# Patient Record
Sex: Male | Born: 1960
Health system: Southern US, Community
[De-identification: ages and names within clinical notes are randomized; demographics above are authoritative.]

## PROBLEM LIST (undated history)

## (undated) DIAGNOSIS — F40298 Other specified phobia: Secondary | ICD-10-CM

## (undated) DIAGNOSIS — I714 Abdominal aortic aneurysm, without rupture, unspecified: Secondary | ICD-10-CM

## (undated) DIAGNOSIS — K219 Gastro-esophageal reflux disease without esophagitis: Secondary | ICD-10-CM

## (undated) DIAGNOSIS — C6292 Malignant neoplasm of left testis, unspecified whether descended or undescended: Secondary | ICD-10-CM

## (undated) DIAGNOSIS — E785 Hyperlipidemia, unspecified: Secondary | ICD-10-CM

## (undated) DIAGNOSIS — I10 Essential (primary) hypertension: Secondary | ICD-10-CM

## (undated) DIAGNOSIS — R51 Headache: Secondary | ICD-10-CM

## (undated) DIAGNOSIS — F419 Anxiety disorder, unspecified: Secondary | ICD-10-CM

## (undated) HISTORY — DX: Essential (primary) hypertension: I10

## (undated) HISTORY — DX: Abdominal aortic aneurysm, without rupture, unspecified: I71.40

## (undated) HISTORY — DX: Malignant neoplasm of left testis, unspecified whether descended or undescended: C62.92

## (undated) HISTORY — DX: Abdominal aortic aneurysm, without rupture: I71.4

---
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
## 2001-05-09 ENCOUNTER — Ambulatory Visit (HOSPITAL_COMMUNITY): Admission: RE | Admit: 2001-05-09 | Discharge: 2001-05-09 | Payer: Self-pay | Admitting: Family Medicine

## 2001-05-09 ENCOUNTER — Encounter: Payer: Self-pay | Admitting: Family Medicine

## 2004-08-18 ENCOUNTER — Encounter: Admission: RE | Admit: 2004-08-18 | Discharge: 2004-08-18 | Payer: Self-pay | Admitting: Occupational Medicine

## 2006-05-24 ENCOUNTER — Ambulatory Visit (HOSPITAL_COMMUNITY): Admission: RE | Admit: 2006-05-24 | Discharge: 2006-05-24 | Payer: Self-pay | Admitting: Family Medicine

## 2010-09-14 ENCOUNTER — Other Ambulatory Visit (HOSPITAL_COMMUNITY): Payer: Self-pay | Admitting: Family Medicine

## 2010-09-14 ENCOUNTER — Ambulatory Visit (HOSPITAL_COMMUNITY)
Admission: RE | Admit: 2010-09-14 | Discharge: 2010-09-14 | Disposition: A | Discharge status: Home / Self Care | Payer: BC Managed Care – PPO | Source: Ambulatory Visit | Attending: Family Medicine | Admitting: Family Medicine

## 2010-09-14 ENCOUNTER — Encounter (HOSPITAL_COMMUNITY): Payer: Self-pay

## 2010-09-14 DIAGNOSIS — I10 Essential (primary) hypertension: Secondary | ICD-10-CM | POA: Insufficient documentation

## 2010-09-14 DIAGNOSIS — R918 Other nonspecific abnormal finding of lung field: Secondary | ICD-10-CM | POA: Insufficient documentation

## 2010-09-14 DIAGNOSIS — Z0000 Encounter for general adult medical examination without abnormal findings: Secondary | ICD-10-CM | POA: Insufficient documentation

## 2010-09-15 ENCOUNTER — Ambulatory Visit (HOSPITAL_COMMUNITY)
Admission: RE | Admit: 2010-09-15 | Discharge: 2010-09-15 | Disposition: A | Discharge status: Home / Self Care | Payer: BC Managed Care – PPO | Source: Ambulatory Visit | Attending: Family Medicine | Admitting: Family Medicine

## 2010-09-15 ENCOUNTER — Other Ambulatory Visit (HOSPITAL_COMMUNITY): Payer: Self-pay | Admitting: Family Medicine

## 2010-09-15 DIAGNOSIS — R918 Other nonspecific abnormal finding of lung field: Secondary | ICD-10-CM | POA: Insufficient documentation

## 2010-09-15 DIAGNOSIS — I10 Essential (primary) hypertension: Secondary | ICD-10-CM

## 2011-10-20 ENCOUNTER — Encounter (INDEPENDENT_AMBULATORY_CARE_PROVIDER_SITE_OTHER): Payer: Self-pay | Admitting: *Deleted

## 2011-11-01 ENCOUNTER — Encounter (INDEPENDENT_AMBULATORY_CARE_PROVIDER_SITE_OTHER): Payer: Self-pay | Admitting: Internal Medicine

## 2011-11-01 ENCOUNTER — Ambulatory Visit (INDEPENDENT_AMBULATORY_CARE_PROVIDER_SITE_OTHER): Payer: BC Managed Care – PPO | Admitting: Internal Medicine

## 2011-11-01 VITALS — BP 110/80 | HR 72 | Temp 98.1°F | Ht 73.0 in | Wt 197.2 lb

## 2011-11-01 DIAGNOSIS — K649 Unspecified hemorrhoids: Secondary | ICD-10-CM | POA: Insufficient documentation

## 2011-11-01 DIAGNOSIS — K625 Hemorrhage of anus and rectum: Secondary | ICD-10-CM

## 2011-11-01 NOTE — Patient Instructions (Addendum)
Screening colonoscopy 

## 2011-11-01 NOTE — Progress Notes (Signed)
Subjective:     Patient ID: Shane Faulkner, male   DOB: [birth year], 51 y.o.   MRN: [id number]  HPI [place] is a 51 yr old male referred to our office for rectal bleeding. He says he has had rectal bleeding which he says was hemorrhoidal. He says his hemorrhoids are external. He was given an Rx for Protofoam.  Appetite is good.  No weight loss. He has a BM about 1-2 a day.  No abdominal pain. Patient is due for a screening colonoscopy.   Review of Systems see hpi Current Outpatient Prescriptions  Medication Sig Dispense Refill  . amLODipine (NORVASC) 5 MG tablet Take 5 mg by mouth daily.      Marland Kitchen lisinopril-hydrochlorothiazide (PRINZIDE,ZESTORETIC) 20-25 MG per tablet Take 1 tablet by mouth daily.      . naproxen (NAPROSYN) 500 MG tablet Take 500 mg by mouth 2 (two) times daily with a meal.       Past Medical History  Diagnosis Date  . Hypertension   . Hypertension    Past Surgical History  Procedure Date  . Tonsillectomy    Family Status  Relation Status Death Age  . Mother Alive     good health  . Father Alive     Crohn's disease   History   Social History  . Marital Status: Divorced    Spouse Name: N/A    Number of Children: N/A  . Years of Education: N/A   Occupational History  . Not on file.   Social History Main Topics  . Smoking status: Current Everyday Smoker  . Smokeless tobacco: Not on file   Comment: 1 1/2 PACK A DAY.  Marland Kitchen Alcohol Use: Yes     Drinks a case a week  . Drug Use: Not on file  . Sexually Active: Not on file   Other Topics Concern  . Not on file   Social History Narrative  . No narrative on file   Allergies  Allergen Reactions  . Penicillins    Past Medical History  Diagnosis Date  . Hypertension   . Hypertension         Objective:   Physical Exam Filed Vitals:   11/01/11 1540  Height: 6\' 1"  (1.854 m)  Weight: 197 lb 3.2 oz (89.449 kg)   Alert and oriented. Skin warm and dry. Oral mucosa is moist.   . Sclera anicteric,  conjunctivae is pink. Thyroid not enlarged. No cervical lymphadenopathy. Lungs clear. Heart regular rate and rhythm.  Abdomen is soft. Bowel sounds are positive. No hepatomegaly. No abdominal masses felt. No tenderness. External hemorrhoids noted.  No edema to lower extremities.       Assessment:    In need of screening colonoscopy. Hx of rectal bleeding. Colonic neoplasm needs to be ruled out as well as internal hemorrhoids.    Plan:    Colonoscopy,The risks and benefits such as perforation, bleeding, and infection were reviewed with the patient and is agreeable.

## 2011-11-11 ENCOUNTER — Telehealth (INDEPENDENT_AMBULATORY_CARE_PROVIDER_SITE_OTHER): Payer: Self-pay | Admitting: *Deleted

## 2011-11-11 ENCOUNTER — Encounter (INDEPENDENT_AMBULATORY_CARE_PROVIDER_SITE_OTHER): Payer: Self-pay | Admitting: *Deleted

## 2011-11-11 NOTE — Telephone Encounter (Signed)
Patient was seen 7/2 bu Terri and needs to be sch'd for TCS, I left several messages for patient to call me but no response. I have mailed him a letter asking him to me to sch TCS

## 2012-05-17 ENCOUNTER — Other Ambulatory Visit (HOSPITAL_COMMUNITY): Payer: Self-pay | Admitting: Internal Medicine

## 2012-05-17 DIAGNOSIS — D2920 Benign neoplasm of unspecified testis: Secondary | ICD-10-CM

## 2012-05-18 ENCOUNTER — Ambulatory Visit (HOSPITAL_COMMUNITY)
Admission: RE | Admit: 2012-05-18 | Discharge: 2012-05-18 | Disposition: A | Discharge status: Home / Self Care | Payer: BC Managed Care – PPO | Source: Ambulatory Visit | Attending: Internal Medicine | Admitting: Internal Medicine

## 2012-05-18 ENCOUNTER — Other Ambulatory Visit (HOSPITAL_COMMUNITY): Payer: Self-pay | Admitting: Internal Medicine

## 2012-05-18 DIAGNOSIS — R9389 Abnormal findings on diagnostic imaging of other specified body structures: Secondary | ICD-10-CM | POA: Insufficient documentation

## 2012-05-18 DIAGNOSIS — D2920 Benign neoplasm of unspecified testis: Secondary | ICD-10-CM

## 2012-05-18 DIAGNOSIS — N5089 Other specified disorders of the male genital organs: Secondary | ICD-10-CM | POA: Insufficient documentation

## 2012-05-29 ENCOUNTER — Ambulatory Visit (INDEPENDENT_AMBULATORY_CARE_PROVIDER_SITE_OTHER): Payer: BC Managed Care – PPO | Admitting: Urology

## 2012-05-29 ENCOUNTER — Other Ambulatory Visit: Payer: Self-pay | Admitting: Urology

## 2012-05-29 DIAGNOSIS — E299 Testicular dysfunction, unspecified: Secondary | ICD-10-CM

## 2012-05-30 NOTE — Patient Instructions (Signed)
Your procedure is scheduled on:06/05/2012   Report to [facility] at 6:15    AM.  Call this number if you have problems the morning of surgery: [phone]   Remember:   Do not drink or eat food:After Midnight.  :  Take these medicines the morning of surgery with A SIP OF WATER: Amlodipine, lisinopril   Do not wear jewelry, make-up or nail polish.  Do not wear lotions, powders, or perfumes. You may wear deodorant.  Do not shave 48 hours prior to surgery. Men may shave face and neck.  Do not bring valuables to the hospital.  Contacts, dentures or bridgework may not be worn into surgery.  Leave suitcase in the car. After surgery it may be brought to your room.  For patients admitted to the hospital, checkout time is 11:00 AM the day of discharge.   Patients discharged the day of surgery will not be allowed to drive home.    Special Instructions: Shower using CHG 2 nights before surgery and the night before surgery.  If you shower the day of surgery use CHG.  Use special wash - you have one bottle of CHG for all showers.  You should use approximately 1/3 of the bottle for each shower.   Please read over the following fact sheets that you were given: Pain Booklet, MRSA Information, Surgical Site Infection Prevention and Care and Recovery After Surgery   Orchiectomy An orchiectomy is the removal of the testicles. This is something you should have discussed at length with your surgeon or caregiver prior to having this done. This is a man's main source of the male sex hormone testosterone. An orchiectomy is most often done to treat cancer of the prostate. Prostatic cancer usually needs testosterone to grow.  The main advantages of this procedure are that it is safe, effective and simple with a low risk of problems or complications. Surgery is also less expensive than monthly injections over the long run. The surgery has the same effects as hormone treatment. It eliminates the need for daily pills or  monthly shots. Drawbacks to surgery are it is permanent. This procedure can be done in ways that make you appear to be anatomically intact following the surgery. The testicles can be replaced with artificial testicles.  The major disadvantage seems to be psychological. Some men consider this procedure a loss of being a male. However, the psychological impact should be weighed against the therapeutic benefits of the procedure in its treatment for prostate cancer  PROCEDURE  This procedure may be done as an outpatient procedure or sometimes as an inpatient with a short hospital stay. Regular activities are usually resumed within 1 to 2 weeks. Full recovery can take up to a month. This is a surgery which can be done under local anesthesia. This means the area being worked on will be made numb with a medication similar to Novocaine. Sometimes a general anesthetic or light sedation may be used and you may be sleeping during the procedure. A spinal anesthetic can also be used in which you are numb from the waist down.  After surgery, you will be taken to the recovery area where a nurse will watch you and check your progress. Once awake, stable, and taking fluids well, without other problems you will be allowed to go home. HOME CARE INSTRUCTIONS  Once home, an ice pack applied to the operative site for 15 to 20 minutes, 3 to 4 times per day may help with discomfort and  keep swelling down. Place a towel between your skin and the ice pack.  Only take over-the-counter or prescription medicines for pain, discomfort, or fever as directed by your caregiver.  You may continue a normal diet and activities as directed.  There should be no heavy lifting (more than 10 pounds), strenuous activities or contact sports for four weeks, or as directed.  Change dressings as directed. Keep the wound dry and clean. The wound may be washed gently with soap and water. Gently blot or dab dry without rubbing. Do not take baths,  use swimming pools or hot tubs for ten days, or as instructed by your caregivers. SEEK MEDICAL CARE IF:   There is redness, swelling, or increasing pain in the wound area.  Pus is coming from the wound.  An unexplained oral temperature above 102 F (38.9 C) develops.  You notice a foul smell coming from the wound or dressing.  A breaking open of the wound (edges not staying together) after sutures have been removed.  There is increasing abdominal pain. SEEK IMMEDIATE MEDICAL CARE IF:   A rash develops.  You have difficulty breathing, or you develop a reaction or side effects to medications given. Document Released: 03/18/2005 Document Revised: 07/11/2011 Document Reviewed: 05/21/2008 [facility] Patient Information 2013 [place], [place]. PATIENT INSTRUCTIONS POST-ANESTHESIA  IMMEDIATELY FOLLOWING SURGERY:  Do not drive or operate machinery for the first twenty four hours after surgery.  Do not make any important decisions for twenty four hours after surgery or while taking narcotic pain medications or sedatives.  If you develop intractable nausea and vomiting or a severe headache please notify your doctor immediately.  FOLLOW-UP:  Please make an appointment with your surgeon as instructed. You do not need to follow up with anesthesia unless specifically instructed to do so.  WOUND CARE INSTRUCTIONS (if applicable):  Keep a dry clean dressing on the anesthesia/puncture wound site if there is drainage.  Once the wound has quit draining you may leave it open to air.  Generally you should leave the bandage intact for twenty four hours unless there is drainage.  If the epidural site drains for more than 36-48 hours please call the anesthesia department.  QUESTIONS?:  Please feel free to call your physician or the hospital operator if you have any questions, and they will be happy to assist you.

## 2012-05-31 ENCOUNTER — Encounter (HOSPITAL_COMMUNITY): Admission: RE | Admit: 2012-05-31 | Discharge: 2012-05-31 | Payer: BC Managed Care – PPO | Source: Ambulatory Visit

## 2012-05-31 ENCOUNTER — Encounter (HOSPITAL_COMMUNITY): Payer: Self-pay | Admitting: Pharmacy Technician

## 2012-06-01 NOTE — Patient Instructions (Signed)
Shane Faulkner  06/01/2012   Your procedure is scheduled on:  06/05/12  Report to [facility] at [place] AM.  Call this number if you have problems the morning of surgery: [phone]   Remember:   Do not eat food or drink liquids after midnight.   Take these medicines the morning of surgery with A SIP OF WATER: norvasc, zestoretic   Do not wear jewelry, make-up or nail polish.  Do not wear lotions, powders, or perfumes. You may wear deodorant.  Do not shave 48 hours prior to surgery. Men may shave face and neck.  Do not bring valuables to the hospital.  Contacts, dentures or bridgework may not be worn into surgery.  Leave suitcase in the car. After surgery it may be brought to your room.  For patients admitted to the hospital, checkout time is 11:00 AM the day of  discharge.   Patients discharged the day of surgery will not be allowed to drive  home.  Name and phone number of your driver: family  Special Instructions: Shower using CHG 2 nights before surgery and the night before surgery.  If you shower the day of surgery use CHG.  Use special wash - you have one bottle of CHG for all showers.  You should use approximately 1/3 of the bottle for each shower.   Please read over the following fact sheets that you were given: Pain Booklet, MRSA Information, Surgical Site Infection Prevention, Anesthesia Post-op Instructions and Care and Recovery After Surgery   PATIENT INSTRUCTIONS POST-ANESTHESIA  IMMEDIATELY FOLLOWING SURGERY:  Do not drive or operate machinery for the first twenty four hours after surgery.  Do not make any important decisions for twenty four hours after surgery or while taking narcotic pain medications or sedatives.  If you develop intractable nausea and vomiting or a severe headache please notify your doctor immediately.  FOLLOW-UP:  Please make an appointment with your surgeon as instructed. You do not need to follow up with anesthesia unless specifically instructed to do  so.  WOUND CARE INSTRUCTIONS (if applicable):  Keep a dry clean dressing on the anesthesia/puncture wound site if there is drainage.  Once the wound has quit draining you may leave it open to air.  Generally you should leave the bandage intact for twenty four hours unless there is drainage.  If the epidural site drains for more than 36-48 hours please call the anesthesia department.  QUESTIONS?:  Please feel free to call your physician or the hospital operator if you have any questions, and they will be happy to assist you.      Orchiectomy An orchiectomy is the removal of the testicles. This is something you should have discussed at length with your surgeon or caregiver prior to having this done. This is a man's main source of the male sex hormone testosterone. An orchiectomy is most often done to treat cancer of the prostate. Prostatic cancer usually needs testosterone to grow.  The main advantages of this procedure are that it is safe, effective and simple with a low risk of problems or complications. Surgery is also less expensive than monthly injections over the long run. The surgery has the same effects as hormone treatment. It eliminates the need for daily pills or monthly shots. Drawbacks to surgery are it is permanent. This procedure can be done in ways that make you appear to be anatomically intact following the surgery. The testicles can be replaced with artificial testicles.  The major disadvantage seems to  be psychological. Some men consider this procedure a loss of being a male. However, the psychological impact should be weighed against the therapeutic benefits of the procedure in its treatment for prostate cancer  PROCEDURE  This procedure may be done as an outpatient procedure or sometimes as an inpatient with a short hospital stay. Regular activities are usually resumed within 1 to 2 weeks. Full recovery can take up to a month. This is a surgery which can be done under local  anesthesia. This means the area being worked on will be made numb with a medication similar to Novocaine. Sometimes a general anesthetic or light sedation may be used and you may be sleeping during the procedure. A spinal anesthetic can also be used in which you are numb from the waist down.  After surgery, you will be taken to the recovery area where a nurse will watch you and check your progress. Once awake, stable, and taking fluids well, without other problems you will be allowed to go home. HOME CARE INSTRUCTIONS  Once home, an ice pack applied to the operative site for 15 to 20 minutes, 3 to 4 times per day may help with discomfort and keep swelling down. Place a towel between your skin and the ice pack.  Only take over-the-counter or prescription medicines for pain, discomfort, or fever as directed by your caregiver.  You may continue a normal diet and activities as directed.  There should be no heavy lifting (more than 10 pounds), strenuous activities or contact sports for four weeks, or as directed.  Change dressings as directed. Keep the wound dry and clean. The wound may be washed gently with soap and water. Gently blot or dab dry without rubbing. Do not take baths, use swimming pools or hot tubs for ten days, or as instructed by your caregivers. SEEK MEDICAL CARE IF:   There is redness, swelling, or increasing pain in the wound area.  Pus is coming from the wound.  An unexplained oral temperature above 102 F (38.9 C) develops.  You notice a foul smell coming from the wound or dressing.  A breaking open of the wound (edges not staying together) after sutures have been removed.  There is increasing abdominal pain. SEEK IMMEDIATE MEDICAL CARE IF:   A rash develops.  You have difficulty breathing, or you develop a reaction or side effects to medications given. Document Released: 03/18/2005 Document Revised: 07/11/2011 Document Reviewed: 05/21/2008 [facility] Patient  Information 2013 [place], [place].

## 2012-06-02 DIAGNOSIS — C6292 Malignant neoplasm of left testis, unspecified whether descended or undescended: Secondary | ICD-10-CM

## 2012-06-02 HISTORY — DX: Malignant neoplasm of left testis, unspecified whether descended or undescended: C62.92

## 2012-06-04 ENCOUNTER — Encounter (HOSPITAL_COMMUNITY)
Admission: RE | Admit: 2012-06-04 | Discharge: 2012-06-04 | Disposition: A | Discharge status: Home / Self Care | Payer: BC Managed Care – PPO | Source: Ambulatory Visit | Attending: Urology | Admitting: Urology

## 2012-06-04 ENCOUNTER — Encounter (HOSPITAL_COMMUNITY): Payer: Self-pay

## 2012-06-04 ENCOUNTER — Other Ambulatory Visit: Payer: Self-pay

## 2012-06-04 HISTORY — DX: Gastro-esophageal reflux disease without esophagitis: K21.9

## 2012-06-04 HISTORY — DX: Hyperlipidemia, unspecified: E78.5

## 2012-06-04 HISTORY — DX: Headache: R51

## 2012-06-04 LAB — BASIC METABOLIC PANEL
Calcium: 10.2 mg/dL (ref 8.4–10.5)
Creatinine, Ser: 1.28 mg/dL (ref 0.50–1.35)
GFR calc Af Amer: 73 mL/min — ABNORMAL LOW (ref >90)
GFR calc non Af Amer: 63 mL/min — ABNORMAL LOW (ref >90)

## 2012-06-04 LAB — HEMOGLOBIN AND HEMATOCRIT, BLOOD: Hemoglobin: 17.7 g/dL — ABNORMAL HIGH (ref 13.0–17.0)

## 2012-06-04 LAB — SURGICAL PCR SCREEN
MRSA, PCR: NEGATIVE
Staphylococcus aureus: NEGATIVE

## 2012-06-04 NOTE — H&P (Signed)
Urology History and Physical Exam  CC: Testicular mass  HPI: 52 year old male presents for left inguinal orchiectomy for a probable left testicular carcinoma. He was seen a week ago for a left testicular mass. His AFP and LDH levels were normal, his B HCG was elevated. His initial note is below:   This 52 year old male is referred by Dr. Assunta Found for evaluation and management of a left testicular mass. The patient states that he first noted this about a month ago. It has grown in size. He has no pain or tenderness associated with this. He had a recent scrotal ultrasound which revealed a heterogeneous mass of the left testicle, with a normal appearing right testicle. It was recommended that he see a urologist. He denies any infections or trauma to his testicles in the past.  He is a smoker. He works for a [employer]. He denies any cardiac or pulmonary issues. He does have erectile dysfunction, and has used Cialis for this in the past successfully.    PMH: Past Medical History  Diagnosis Date  . Hypertension   . Hypertension     PSH: Past Surgical History  Procedure Date  . Tonsillectomy     Allergies: Allergies  Allergen Reactions  . Penicillins     Medications: No prescriptions prior to admission     Social History: History   Social History  . Marital Status: Divorced    Spouse Name: N/A    Number of Children: N/A  . Years of Education: N/A   Occupational History  . Not on file.   Social History Main Topics  . Smoking status: Current Every Day Smoker  . Smokeless tobacco: Not on file     Comment: 1 1/2 PACK A DAY.  Marland Kitchen Alcohol Use: Yes     Comment: Drinks a case a week  . Drug Use: Not on file  . Sexually Active: Not on file   Other Topics Concern  . Not on file   Social History Narrative  . No narrative on file    Family History: No family history on file.  Review of Systems: Genitourinary, constitutional, skin, eye, otolaryngeal,  hematologic/lymphatic, cardiovascular, pulmonary, endocrine, musculoskeletal, gastrointestinal, neurological and psychiatric system(s) were reviewed and pertinent findings if present are noted.  Genitourinary: nocturia and erectile dysfunction.    Constitutional: Well nourished and well developed . No acute distress.  ENT:. The ears and nose are normal in appearance.  Neck: The appearance of the neck is normal and no neck mass is present.  Pulmonary: No respiratory distress and normal respiratory rhythm and effort.  Cardiovascular: Heart rate and rhythm are normal . No peripheral edema.  Abdomen: The abdomen is rounded. The abdomen is soft and nontender. No masses are palpated. No CVA tenderness. No hernias are palpable. No hepatosplenomegaly noted.  Rectal: Rectal exam demonstrates normal sphincter tone, the anus is normal on inspection., no tenderness and no masses. Estimated prostate size is 2+. Normal rectal tone, no rectal masses, prostate is smooth, symmetric and non-tender. The prostate has no nodularity and is not tender. The left seminal vesicle is nonpalpable. The right seminal vesicle is nonpalpable. The perineum is normal on inspection.  Genitourinary: The penis is circumcised. The scrotum is normal in appearance. The right vas deferens is is palpably normal. The left vas deferens is palpably normal. The right testis is palpably normal. The left testis is found to have a 3.0 cm mass.  Skin: Normal skin turgor, no visible rash and  no visible skin lesions.  Neuro/Psych:. Mood and affect are appropriate.                      Studies:  No results found for this basename: HGB:2,WBC:2,PLT:2 in the last 72 hours  No results found for this basename: NA:2,K:2,CL:2,CO2:2,BUN:2,CREATININE:2,CALCIUM:2,MAGNESIUM:2,GFRNONAA:2,GFRAA:2 in the last 72 hours   No results found for this basename: PT:2,INR:2,APTT:2 in the last 72 hours   No components found with this basename:  ABG:2    Assessment:   1. 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  2. Erectile dysfunction, organic. He uses Cialis for this.   Plan: Left inguinal orchiectomy

## 2012-06-04 NOTE — Progress Notes (Signed)
06/04/12 1411  OBSTRUCTIVE SLEEP APNEA  Have you ever been diagnosed with sleep apnea through a sleep study? No  Do you snore loudly (loud enough to be heard through closed doors)?  1  Do you often feel tired, fatigued, or sleepy during the daytime? 1  Has anyone observed you stop breathing during your sleep? 1  Do you have, or are you being treated for high blood pressure? 1  BMI more than 35 kg/m2? 0  Age over 52 years old? 1  Neck circumference greater than 40 cm/18 inches? 0  Gender: 1  Obstructive Sleep Apnea Score 6   Score 4 or greater  Results sent to PCP

## 2012-06-05 ENCOUNTER — Encounter (HOSPITAL_COMMUNITY): Payer: Self-pay | Admitting: *Deleted

## 2012-06-05 ENCOUNTER — Ambulatory Visit (HOSPITAL_COMMUNITY): Payer: BC Managed Care – PPO | Admitting: Anesthesiology

## 2012-06-05 ENCOUNTER — Ambulatory Visit (HOSPITAL_COMMUNITY)
Admission: RE | Admit: 2012-06-05 | Discharge: 2012-06-05 | Disposition: A | Discharge status: Home / Self Care | Payer: BC Managed Care – PPO | Source: Ambulatory Visit | Attending: Urology | Admitting: Urology

## 2012-06-05 ENCOUNTER — Encounter (HOSPITAL_COMMUNITY): Payer: Self-pay | Admitting: Anesthesiology

## 2012-06-05 ENCOUNTER — Encounter (HOSPITAL_COMMUNITY)
Admission: RE | Disposition: A | Discharge status: Home / Self Care | Payer: Self-pay | Source: Ambulatory Visit | Attending: Urology

## 2012-06-05 DIAGNOSIS — Z01810 Encounter for preprocedural cardiovascular examination: Secondary | ICD-10-CM | POA: Insufficient documentation

## 2012-06-05 DIAGNOSIS — C6290 Malignant neoplasm of unspecified testis, unspecified whether descended or undescended: Secondary | ICD-10-CM | POA: Insufficient documentation

## 2012-06-05 DIAGNOSIS — Z01812 Encounter for preprocedural laboratory examination: Secondary | ICD-10-CM | POA: Insufficient documentation

## 2012-06-05 DIAGNOSIS — I10 Essential (primary) hypertension: Secondary | ICD-10-CM | POA: Insufficient documentation

## 2012-06-05 HISTORY — PX: ORCHIECTOMY: SHX2116

## 2012-06-05 SURGERY — ORCHIECTOMY
Anesthesia: General | Laterality: Left | Wound class: Clean

## 2012-06-05 MED ORDER — ONDANSETRON HCL 4 MG/2ML IJ SOLN: 4.0000 mg | Freq: Four times a day (QID) | INTRAMUSCULAR | Status: DC | PRN

## 2012-06-05 MED ORDER — NEOSTIGMINE METHYLSULFATE 1 MG/ML IJ SOLN
INTRAMUSCULAR | Status: DC | PRN
  Administered 2012-06-05: 1 mg via INTRAVENOUS
  Administered 2012-06-05: 4 mg via INTRAVENOUS

## 2012-06-05 MED ORDER — SODIUM CHLORIDE 0.9 % IJ SOLN: 3.0000 mL | INTRAMUSCULAR | Status: DC | PRN

## 2012-06-05 MED ORDER — SODIUM CHLORIDE 0.9 % IJ SOLN: 3.0000 mL | Freq: Two times a day (BID) | INTRAMUSCULAR | Status: DC

## 2012-06-05 MED ORDER — KETOROLAC TROMETHAMINE 30 MG/ML IJ SOLN
INTRAMUSCULAR | Status: AC
  Filled 2012-06-05: qty 1

## 2012-06-05 MED ORDER — MIDAZOLAM HCL 2 MG/2ML IJ SOLN
INTRAMUSCULAR | Status: AC
  Filled 2012-06-05: qty 2

## 2012-06-05 MED ORDER — OXYCODONE HCL 5 MG PO TABS
5.0000 mg | ORAL_TABLET | ORAL | Status: DC | PRN
  Administered 2012-06-05: 5 mg via ORAL

## 2012-06-05 MED ORDER — ALBUTEROL SULFATE HFA 108 (90 BASE) MCG/ACT IN AERS
INHALATION_SPRAY | RESPIRATORY_TRACT | Status: AC
  Filled 2012-06-05: qty 6.7

## 2012-06-05 MED ORDER — PROPOFOL 10 MG/ML IV EMUL
INTRAVENOUS | Status: AC
  Filled 2012-06-05: qty 20

## 2012-06-05 MED ORDER — BUPIVACAINE HCL (PF) 0.25 % IJ SOLN
INTRAMUSCULAR | Status: DC | PRN
  Administered 2012-06-05: 20 mL

## 2012-06-05 MED ORDER — 0.9 % SODIUM CHLORIDE (POUR BTL) OPTIME
TOPICAL | Status: DC | PRN
  Administered 2012-06-05: 1000 mL

## 2012-06-05 MED ORDER — SODIUM CHLORIDE 0.9 % IV SOLN: 250.0000 mL | INTRAVENOUS | Status: DC | PRN

## 2012-06-05 MED ORDER — KETOROLAC TROMETHAMINE 30 MG/ML IJ SOLN
30.0000 mg | Freq: Four times a day (QID) | INTRAMUSCULAR | Status: DC
  Administered 2012-06-05: 30 mg via INTRAVENOUS

## 2012-06-05 MED ORDER — ROCURONIUM BROMIDE 100 MG/10ML IV SOLN
INTRAVENOUS | Status: DC | PRN
  Administered 2012-06-05: 40 mg via INTRAVENOUS

## 2012-06-05 MED ORDER — LIDOCAINE HCL (CARDIAC) 10 MG/ML IV SOLN
INTRAVENOUS | Status: DC | PRN
  Administered 2012-06-05: 20 mg via INTRAVENOUS

## 2012-06-05 MED ORDER — BUPIVACAINE HCL (PF) 0.25 % IJ SOLN
INTRAMUSCULAR | Status: AC
  Filled 2012-06-05: qty 30

## 2012-06-05 MED ORDER — GLYCOPYRROLATE 0.2 MG/ML IJ SOLN
INTRAMUSCULAR | Status: AC
  Filled 2012-06-05: qty 3

## 2012-06-05 MED ORDER — GLYCOPYRROLATE 0.2 MG/ML IJ SOLN
INTRAMUSCULAR | Status: AC
  Filled 2012-06-05: qty 1

## 2012-06-05 MED ORDER — PROPOFOL 10 MG/ML IV BOLUS
INTRAVENOUS | Status: DC | PRN
  Administered 2012-06-05: 150 mg via INTRAVENOUS
  Administered 2012-06-05: 50 mg via INTRAVENOUS

## 2012-06-05 MED ORDER — FENTANYL CITRATE 0.05 MG/ML IJ SOLN
INTRAMUSCULAR | Status: AC
  Filled 2012-06-05: qty 5

## 2012-06-05 MED ORDER — GLYCOPYRROLATE 0.2 MG/ML IJ SOLN
INTRAMUSCULAR | Status: DC | PRN
  Administered 2012-06-05: 0.2 mg via INTRAVENOUS
  Administered 2012-06-05: .8 mg via INTRAVENOUS

## 2012-06-05 MED ORDER — CIPROFLOXACIN IN D5W 400 MG/200ML IV SOLN
400.0000 mg | INTRAVENOUS | Status: AC
  Administered 2012-06-05: 400 mg via INTRAVENOUS

## 2012-06-05 MED ORDER — ONDANSETRON HCL 4 MG/2ML IJ SOLN: 4.0000 mg | Freq: Once | INTRAMUSCULAR | Status: DC | PRN

## 2012-06-05 MED ORDER — FENTANYL CITRATE 0.05 MG/ML IJ SOLN: 25.0000 ug | INTRAMUSCULAR | Status: DC | PRN

## 2012-06-05 MED ORDER — HYDROCODONE-ACETAMINOPHEN 5-500 MG PO CAPS: 1.0000 | ORAL_CAPSULE | ORAL | Status: DC | PRN

## 2012-06-05 MED ORDER — MIDAZOLAM HCL 2 MG/2ML IJ SOLN
1.0000 mg | INTRAMUSCULAR | Status: DC | PRN
  Administered 2012-06-05 (×2): 2 mg via INTRAVENOUS

## 2012-06-05 MED ORDER — NEOSTIGMINE METHYLSULFATE 1 MG/ML IJ SOLN
INTRAMUSCULAR | Status: AC
  Filled 2012-06-05: qty 1

## 2012-06-05 MED ORDER — FENTANYL CITRATE 0.05 MG/ML IJ SOLN
INTRAMUSCULAR | Status: DC | PRN
  Administered 2012-06-05 (×2): 100 ug via INTRAVENOUS

## 2012-06-05 MED ORDER — CIPROFLOXACIN IN D5W 400 MG/200ML IV SOLN
INTRAVENOUS | Status: AC
  Filled 2012-06-05: qty 200

## 2012-06-05 MED ORDER — ALBUTEROL SULFATE HFA 108 (90 BASE) MCG/ACT IN AERS
INHALATION_SPRAY | RESPIRATORY_TRACT | Status: DC | PRN
  Administered 2012-06-05: 4 via RESPIRATORY_TRACT
  Administered 2012-06-05: 2 via RESPIRATORY_TRACT

## 2012-06-05 MED ORDER — LIDOCAINE HCL (PF) 1 % IJ SOLN
INTRAMUSCULAR | Status: AC
  Filled 2012-06-05: qty 5

## 2012-06-05 MED ORDER — LACTATED RINGERS IV SOLN
INTRAVENOUS | Status: DC
  Administered 2012-06-05: 1000 mL via INTRAVENOUS

## 2012-06-05 MED ORDER — ONDANSETRON HCL 4 MG/2ML IJ SOLN
4.0000 mg | Freq: Once | INTRAMUSCULAR | Status: AC
  Administered 2012-06-05: 4 mg via INTRAVENOUS

## 2012-06-05 MED ORDER — OXYCODONE HCL 5 MG PO TABS
ORAL_TABLET | ORAL | Status: AC
  Filled 2012-06-05: qty 1

## 2012-06-05 MED ORDER — ONDANSETRON HCL 4 MG/2ML IJ SOLN
INTRAMUSCULAR | Status: AC
  Filled 2012-06-05: qty 2

## 2012-06-05 SURGICAL SUPPLY — 35 items
BANDAGE GAUZE ELAST BULKY 4 IN (GAUZE/BANDAGES/DRESSINGS) ×2 IMPLANT
BLADE HEX COATED 2.75 (ELECTRODE) ×2 IMPLANT
CLOTH BEACON ORANGE TIMEOUT ST (SAFETY) ×2 IMPLANT
COVER SURGICAL LIGHT HANDLE (MISCELLANEOUS) ×4 IMPLANT
DERMABOND ADVANCED (GAUZE/BANDAGES/DRESSINGS) ×1
DERMABOND ADVANCED .7 DNX12 (GAUZE/BANDAGES/DRESSINGS) ×1 IMPLANT
DRAIN PENROSE 12X.25 LTX STRL (MISCELLANEOUS) ×2 IMPLANT
DRAIN PENROSE 18X1/2 LTX STRL (DRAIN) IMPLANT
DRAIN PENROSE 18X1/4 LTX STRL (WOUND CARE) ×2 IMPLANT
DRAPE PED LAPAROTOMY (DRAPES) IMPLANT
ELECT REM PT RETURN 9FT ADLT (ELECTROSURGICAL) ×2
ELECTRODE REM PT RTRN 9FT ADLT (ELECTROSURGICAL) ×1 IMPLANT
GLOVE BIOGEL M 8.0 STRL (GLOVE) ×2 IMPLANT
GLOVE BIOGEL PI IND STRL 7.0 (GLOVE) ×2 IMPLANT
GLOVE BIOGEL PI INDICATOR 7.0 (GLOVE) ×2
GLOVE EXAM NITRILE MD LF STRL (GLOVE) ×2 IMPLANT
GLOVE SS BIOGEL STRL SZ 6.5 (GLOVE) ×1 IMPLANT
GLOVE SUPERSENSE BIOGEL SZ 6.5 (GLOVE) ×1
GOWN PREVENTION PLUS XLARGE (GOWN DISPOSABLE) ×4 IMPLANT
GOWN STRL REIN XL XLG (GOWN DISPOSABLE) ×2 IMPLANT
INST SET MINOR GENERAL (KITS) ×2 IMPLANT
NEEDLE HYPO 22GX1.5 SAFETY (NEEDLE) IMPLANT
NS IRRIG 1000ML POUR BTL (IV SOLUTION) ×2 IMPLANT
PACK MINOR (CUSTOM PROCEDURE TRAY) ×2 IMPLANT
SET BASIN LINEN APH (SET/KITS/TRAYS/PACK) ×2 IMPLANT
SPONGE GAUZE 4X4 12PLY (GAUZE/BANDAGES/DRESSINGS) ×2 IMPLANT
SUPPORT SCROTAL LG STRP (MISCELLANEOUS) ×2 IMPLANT
SUPPORT SCROTAL LRG NO STRP (SOFTGOODS) IMPLANT
SUPPORT SCROTAL MEDIUM (SOFTGOODS) ×2 IMPLANT
SUPPORT SCROTAL XLRG NO STRP (MISCELLANEOUS) IMPLANT
SUT CHROMIC 3 0 SH 27 (SUTURE) ×2 IMPLANT
SUT MNCRL AB 4-0 PS2 18 (SUTURE) ×2 IMPLANT
SUT VIC AB 2-0 UR5 27 (SUTURE) IMPLANT
SUT VICRYL 0 TIES 12 18 (SUTURE) ×2 IMPLANT
SYR CONTROL 10ML LL (SYRINGE) ×2 IMPLANT

## 2012-06-05 NOTE — Interval H&P Note (Signed)
History and Physical Interval Note:  06/05/2012 7:07 AM  Shane Faulkner  has presented today for surgery, with the diagnosis of left testicular mass  The various methods of treatment have been discussed with the patient and family. After consideration of risks, benefits and other options for treatment, the patient has consented to  Procedure(s) (LRB) with comments: ORCHIECTOMY (Left) - Left Inguinal Orchiectomy as a surgical intervention .  The patient's history has been reviewed, patient examined, no change in status, stable for surgery.  I have reviewed the patient's chart and labs.  Questions were answered to the patient's satisfaction.     Chelsea Aus

## 2012-06-05 NOTE — Transfer of Care (Signed)
Immediate Anesthesia Transfer of Care Note  Patient: Shane Faulkner  Procedure(s) Performed: Procedure(s) (LRB): ORCHIECTOMY (Left)  Patient Location: PACU  Anesthesia Type: General  Level of Consciousness: awake  Airway & Oxygen Therapy: Patient Spontanous Breathing and non-rebreather face mask  Post-op Assessment: Report given to PACU RN, Post -op Vital signs reviewed and stable and Patient moving all extremities  Post vital signs: Reviewed and stable  Complications: No apparent anesthesia complications

## 2012-06-05 NOTE — Op Note (Signed)
Preoperative diagnosis: Left testicular mass  Postoperative diagnosis: Same   Procedure: Left inguinal orchiectomy    Surgeon: Bertram Millard. Rhiley Solem, M.D.   Anesthesia: Gen.   Complications: None  Specimen(s): Left testicle and cord  Drain(s): None  Indications: 52 year-old male with new onset of left testicular mass. I saw him last week in my [place] office. Exam was consistent with testicular carcinoma. Beta hCG, drawn preoperatively, was positive. LDH and alpha-fetoprotein were normal. The patient presents at this time for inguinal orchiectomy as initial treatment for probable testicular cancer. The patient is aware of risks and complications of the procedure including infection, bleeding and anesthetic risk. He desires to proceed.    Technique and findings: The patient was properly identified and marked in the holding area. I answered all questions that the patient had. He was then taken to the operating room where general anesthetic was administered with the endotracheal device. Preoperative IV antibiotics were administered. His left inguinal region as well as his genitalia were prepped and draped. Proper timeout was then performed. A 3 cm incision was then made overlying the patient's left external inguinal ring, and carried down to the fascial layer with combined blunt and sharp dissection. The cord was identified. Dissection was carried down the cord to the testicle. The testicle was pushed up into the inguinal canal, and the gubernaculum was incised with electrocautery. Small vessels were carefully electrocoagulated. Dissection was then carried proximally on the cord, and the cord was then clamped in 2 separate packets using Kelly clamps. Distal to the clamps, the cord was divided, and testicle and cord were sent to pathology. I then used 10 cc of quarter percent Marcaine to block the cord proximal to the 2 Kelly clamps. #1 Vicryl ties were then used to doubly ligate each clamped packet  of cord tissue. Hemostasis was excellent when the clamp was removed. I then pushed the stump of the cord into the inguinal canal. Careful inspection of the dissected site revealed adequate hemostasis. The scrotum was everted, and no bleeding was seen. Another 10 cc of quarter percent plain Marcaine was used to infiltrate the subcutaneous tissue. I then used a 2-0 Vicryl to reapproximate the subcutaneous tissue in a simple running fashion. Skin edges were reapproximated using 4-0 Monocryl placed in a running subcuticular fashion. Dermabond was placed over top of the wound. Fluffs and a compressive undergarment were then placed.  The patient was then awakened and taken to the PACU in stable condition. He tolerated the procedure well.

## 2012-06-05 NOTE — Anesthesia Postprocedure Evaluation (Signed)
Anesthesia Post Note  Patient: Shane Faulkner  Procedure(s) Performed: Procedure(s) (LRB): ORCHIECTOMY (Left)  Anesthesia type: General  Patient location: PACU  Post pain: Pain level controlled  Post assessment: Post-op Vital signs reviewed, Patient's Cardiovascular Status Stable, Respiratory Function Stable, Patent Airway, No signs of Nausea or vomiting and Pain level controlled  Last Vitals:  Filed Vitals:   06/05/12 0818  BP: 142/88  Pulse: 120  Temp: 36.6 C  Resp: 20    Post vital signs: Reviewed and stable  Level of consciousness: awake and alert   Complications: No apparent anesthesia complications

## 2012-06-05 NOTE — Anesthesia Procedure Notes (Signed)
Procedure Name: Intubation Date/Time: 06/05/2012 7:33 AM Performed by: Franco Nones Pre-anesthesia Checklist: Patient identified, Patient being monitored, Timeout performed, Emergency Drugs available and Suction available Patient Re-evaluated:Patient Re-evaluated prior to inductionOxygen Delivery Method: Circle System Utilized Preoxygenation: Pre-oxygenation with 100% oxygen Intubation Type: IV induction, Rapid sequence and Cricoid Pressure applied Ventilation: Mask ventilation without difficulty Laryngoscope Size: Miller and 2 Grade View: Grade I Tube type: Oral Tube size: 7.0 mm Number of attempts: 1 Airway Equipment and Method: stylet Placement Confirmation: ETT inserted through vocal cords under direct vision,  positive ETCO2 and breath sounds checked- equal and bilateral Secured at: 21 cm Tube secured with: Tape Dental Injury: Teeth and Oropharynx as per pre-operative assessment

## 2012-06-05 NOTE — Anesthesia Preprocedure Evaluation (Addendum)
Anesthesia Evaluation  Patient identified by MRN, date of birth, ID band Patient awake    Reviewed: Allergy & Precautions, H&P , NPO status , Patient's Chart, lab work & pertinent test results  Airway Mallampati: II TM Distance: >3 FB     Dental  (+) Teeth Intact   Pulmonary Current Smoker (am cough),  History of childhood asthma; currently  no inhaler use breath sounds clear to auscultation        Cardiovascular hypertension, Pt. on medications Rhythm:Regular Rate:Normal     Neuro/Psych    GI/Hepatic GERD-  Medicated and Controlled,  Endo/Other    Renal/GU      Musculoskeletal   Abdominal   Peds  Hematology   Anesthesia Other Findings   Reproductive/Obstetrics                          Anesthesia Physical Anesthesia Plan  ASA: II  Anesthesia Plan: General   Post-op Pain Management:    Induction: Intravenous, Rapid sequence and Cricoid pressure planned  Airway Management Planned: Oral ETT  Additional Equipment:   Intra-op Plan:   Post-operative Plan: Extubation in OR  Informed Consent: I have reviewed the patients History and Physical, chart, labs and discussed the procedure including the risks, benefits and alternatives for the proposed anesthesia with the patient or authorized representative who has indicated his/her understanding and acceptance.     Plan Discussed with:   Anesthesia Plan Comments:         Anesthesia Quick Evaluation

## 2012-06-07 ENCOUNTER — Encounter (HOSPITAL_COMMUNITY): Payer: Self-pay | Admitting: Urology

## 2012-06-08 ENCOUNTER — Other Ambulatory Visit: Payer: Self-pay | Admitting: Urology

## 2012-06-08 DIAGNOSIS — C6290 Malignant neoplasm of unspecified testis, unspecified whether descended or undescended: Secondary | ICD-10-CM

## 2012-06-12 ENCOUNTER — Ambulatory Visit (INDEPENDENT_AMBULATORY_CARE_PROVIDER_SITE_OTHER): Payer: BC Managed Care – PPO | Admitting: Urology

## 2012-06-12 ENCOUNTER — Ambulatory Visit (HOSPITAL_COMMUNITY)
Admission: RE | Admit: 2012-06-12 | Discharge: 2012-06-12 | Disposition: A | Discharge status: Home / Self Care | Payer: BC Managed Care – PPO | Source: Ambulatory Visit | Attending: Urology | Admitting: Urology

## 2012-06-12 ENCOUNTER — Ambulatory Visit (HOSPITAL_COMMUNITY): Payer: BC Managed Care – PPO

## 2012-06-12 DIAGNOSIS — C6290 Malignant neoplasm of unspecified testis, unspecified whether descended or undescended: Secondary | ICD-10-CM

## 2012-06-12 DIAGNOSIS — N400 Enlarged prostate without lower urinary tract symptoms: Secondary | ICD-10-CM | POA: Insufficient documentation

## 2012-06-12 DIAGNOSIS — I714 Abdominal aortic aneurysm, without rupture, unspecified: Secondary | ICD-10-CM | POA: Insufficient documentation

## 2012-06-12 DIAGNOSIS — Z9889 Other specified postprocedural states: Secondary | ICD-10-CM | POA: Insufficient documentation

## 2012-06-12 MED ORDER — IOHEXOL 300 MG/ML  SOLN
100.0000 mL | Freq: Once | INTRAMUSCULAR | Status: AC | PRN
  Administered 2012-06-12: 100 mL via INTRAVENOUS

## 2012-07-09 ENCOUNTER — Encounter: Payer: Self-pay | Admitting: Vascular Surgery

## 2012-07-10 ENCOUNTER — Ambulatory Visit (INDEPENDENT_AMBULATORY_CARE_PROVIDER_SITE_OTHER): Payer: BC Managed Care – PPO | Admitting: Vascular Surgery

## 2012-07-10 ENCOUNTER — Encounter: Payer: Self-pay | Admitting: Vascular Surgery

## 2012-07-10 VITALS — BP 126/91 | HR 99 | Ht 73.5 in | Wt 194.0 lb

## 2012-07-10 DIAGNOSIS — I714 Abdominal aortic aneurysm, without rupture, unspecified: Secondary | ICD-10-CM

## 2012-07-10 NOTE — Progress Notes (Signed)
[facility]   Patient name: Shane Faulkner MRN: [id number] DOB: 1961-02-04 Sex: male   Referred by: Retta Diones  Reason for referral:  Chief Complaint  Patient presents with  . New Evaluation    AAA - Dr. Retta Diones     HISTORY OF PRESENT ILLNESS: Patient's 52 year old gentleman with the testicular neoplasm. Evaluation this included a CT study which revealed a 4.5centimeter infrarenal abdominal aortic aneurysm this begins below the level renal artery takeoff and stopped at the bifurcation. He does have some atherosclerotic disease in his iliac vessels but no true aneurysm. He does have a family history of his father having aneurysm. He has no symptoms for this. He has no history of cardiac disease.  Past Medical History  Diagnosis Date  . Hypertension   . Hypertension   . GERD (gastroesophageal reflux disease)   . Hyperlipidemia   . Headache   . Cancer     testicular    Past Surgical History  Procedure Laterality Date  . Tonsillectomy  1968  . Orchiectomy  06/05/2012    Procedure: ORCHIECTOMY;  Surgeon: Marcine Matar, MD;  Location: [facility];  Service: Urology;  Laterality: Left;  Left Inguinal Orchiectomy    History   Social History  . Marital Status: Divorced    Spouse Name: N/A    Number of Children: N/A  . Years of Education: N/A   Occupational History  . Not on file.   Social History Main Topics  . Smoking status: Current Every Day Smoker -- 1.50 packs/day for 32 years    Types: Cigarettes  . Smokeless tobacco: Never Used     Comment: pt states that he is going to try the E-cigs today 07/10/2012  . Alcohol Use: 30.6 oz/week    48 Cans of beer, 3 Shots of liquor per week  . Drug Use: No  . Sexually Active: Not on file   Other Topics Concern  . Not on file   Social History Narrative  . No narrative on file    Family History  Problem Relation Age of Onset  . AAA (abdominal aortic aneurysm) Father   . Hyperlipidemia  Father     Allergies as of 07/10/2012 - Review Complete 07/10/2012  Allergen Reaction Noted  . Other Anaphylaxis 06/04/2012  . Penicillins Anaphylaxis 09/14/2010    Current Outpatient Prescriptions on File Prior to Visit  Medication Sig Dispense Refill  . amLODipine (NORVASC) 5 MG tablet Take 5 mg by mouth daily.      . Aspirin-Salicylamide-Caffeine (BC HEADACHE PO) Take by mouth. Once daily as needed for headaches.      Marland Kitchen lisinopril-hydrochlorothiazide (PRINZIDE,ZESTORETIC) 20-25 MG per tablet Take 1 tablet by mouth daily.      . ranitidine (ZANTAC) 150 MG tablet Take 150 mg by mouth daily as needed.      . hydrocodone-acetaminophen (LORCET-HD) 5-500 MG per capsule Take 1 capsule by mouth every 4 (four) hours as needed for pain.  30 capsule  0   No current facility-administered medications on file prior to visit.     REVIEW OF SYSTEMS:  Positives indicated with an "X"  CARDIOVASCULAR:  [ ]  chest pain   [ ]  chest pressure   [ ]  palpitations   [ ]  orthopnea   [ ]  dyspnea on exertion   [ ]  claudication   [ ]  rest pain   [ ]  DVT   [ ]  phlebitis PULMONARY:   [ ]  productive cough   [ ]   asthma   [ ]  wheezing NEUROLOGIC:   [ ]  weakness  [ ]  paresthesias  [ ]  aphasia  [ ]  amaurosis  [ ]  dizziness HEMATOLOGIC:   [ ]  bleeding problems   [ ]  clotting disorders MUSCULOSKELETAL:  [ ]  joint pain   [ ]  joint swelling GASTROINTESTINAL: [ ]   blood in stool  [ ]   hematemesis GENITOURINARY:  [ ]   dysuria  [ ]   hematuria PSYCHIATRIC:  [ ]  history of major depression INTEGUMENTARY:  [ ]  rashes  [ ]  ulcers CONSTITUTIONAL:  [ ]  fever   [ ]  chills  PHYSICAL EXAMINATION:  General: The patient is a well-nourished male, in no acute distress. Vital signs are BP 126/91  Pulse 99  Ht 6' 1.5" (1.867 m)  Wt 194 lb (87.998 kg)  BMI 25.25 kg/m2  SpO2 100% Pulmonary: There is a good air exchange bilaterally without wheezing or rales. Abdomen: Soft and non-tender with normal pitch bowel sounds. He does  have a palpable nontender abdominal aortic aneurysm Musculoskeletal: There are no major deformities.  There is no significant extremity pain. Neurologic: No focal weakness or paresthesias are detected, Skin: There are no ulcer or rashes noted. Psychiatric: The patient has normal affect. Cardiovascular: There is a regular rate and rhythm without significant murmur appreciated. He does have some cyanosis in his feet bilaterally but does have 2+ left dorsalis pedis and right posterior tibial pulse   CT scan, from 06/12/2012 was reviewed. This does show infrarenal abdominal aortic aneurysm  Impression and Plan:  Asymptomatic 4.5 cm infrarenal abdominal aortic aneurysm. A long discussion with the patient and his family present. I would recommend serial surveillance of this. He reports that he is to have a every 3 months CT scan for followup of his testicular neoplasm treatment. We will see him again in 6 months and he knows the symptoms of symptomatic aneurysmal notify us if he should this occur otherwise we'll see him after his 6 month CT scan for follow    Thuan Tippett [facility] [place] Office: [phone]

## 2012-08-07 ENCOUNTER — Telehealth: Payer: Self-pay | Admitting: *Deleted

## 2012-08-07 NOTE — Telephone Encounter (Addendum)
Message copied by Melene Plan on Tue Aug 07, 2012  2:02 PM ------      Message from: FITZPATRICK, DOROTHY K      Created: Mon Aug 06, 2012  9:37 AM      Regarding: question on lifting      Contact: [phone]       Darel Hong, this pt's mother was here today (father is pt too). She wanted to know if this patient, who has a 4.5 aneurysm, is able to pick up 50 lbs at work. If not, he can get assistance but will need a note for work. She wants you to call her back at the above number. Thanks! ------ Per Dr Early it is okay for the patient to lift 50lbs. I have left a message for the patient's mother saying that and asked her to call if any questions.

## 2012-09-14 ENCOUNTER — Ambulatory Visit (HOSPITAL_COMMUNITY)
Admission: RE | Admit: 2012-09-14 | Discharge: 2012-09-14 | Disposition: A | Discharge status: Home / Self Care | Payer: BC Managed Care – PPO | Source: Ambulatory Visit | Attending: Family Medicine | Admitting: Family Medicine

## 2012-09-14 ENCOUNTER — Other Ambulatory Visit (HOSPITAL_COMMUNITY): Payer: Self-pay | Admitting: Family Medicine

## 2012-09-14 DIAGNOSIS — I714 Abdominal aortic aneurysm, without rupture, unspecified: Secondary | ICD-10-CM | POA: Insufficient documentation

## 2012-09-14 DIAGNOSIS — M545 Low back pain, unspecified: Secondary | ICD-10-CM | POA: Insufficient documentation

## 2012-09-14 DIAGNOSIS — Z8547 Personal history of malignant neoplasm of testis: Secondary | ICD-10-CM | POA: Insufficient documentation

## 2012-09-14 DIAGNOSIS — I722 Aneurysm of renal artery: Secondary | ICD-10-CM | POA: Insufficient documentation

## 2012-09-14 MED ORDER — IOHEXOL 300 MG/ML  SOLN
100.0000 mL | Freq: Once | INTRAMUSCULAR | Status: AC | PRN
  Administered 2012-09-14: 100 mL via INTRAVENOUS

## 2012-10-23 ENCOUNTER — Ambulatory Visit: Payer: BC Managed Care – PPO | Admitting: Urology

## 2012-11-27 ENCOUNTER — Ambulatory Visit (INDEPENDENT_AMBULATORY_CARE_PROVIDER_SITE_OTHER): Payer: BC Managed Care – PPO | Admitting: Urology

## 2012-11-27 ENCOUNTER — Other Ambulatory Visit: Payer: Self-pay | Admitting: Urology

## 2012-11-27 DIAGNOSIS — C6292 Malignant neoplasm of left testis, unspecified whether descended or undescended: Secondary | ICD-10-CM

## 2012-11-27 DIAGNOSIS — N529 Male erectile dysfunction, unspecified: Secondary | ICD-10-CM

## 2012-11-27 DIAGNOSIS — C6290 Malignant neoplasm of unspecified testis, unspecified whether descended or undescended: Secondary | ICD-10-CM

## 2013-01-14 ENCOUNTER — Ambulatory Visit (HOSPITAL_COMMUNITY): Payer: BC Managed Care – PPO

## 2013-01-15 ENCOUNTER — Ambulatory Visit: Payer: BC Managed Care – PPO | Admitting: Vascular Surgery

## 2013-01-17 ENCOUNTER — Ambulatory Visit (HOSPITAL_COMMUNITY)
Admission: RE | Admit: 2013-01-17 | Discharge: 2013-01-17 | Disposition: A | Discharge status: Home / Self Care | Payer: BC Managed Care – PPO | Source: Ambulatory Visit | Attending: Urology | Admitting: Urology

## 2013-01-17 DIAGNOSIS — I714 Abdominal aortic aneurysm, without rupture, unspecified: Secondary | ICD-10-CM | POA: Insufficient documentation

## 2013-01-17 DIAGNOSIS — R911 Solitary pulmonary nodule: Secondary | ICD-10-CM | POA: Insufficient documentation

## 2013-01-17 DIAGNOSIS — C6292 Malignant neoplasm of left testis, unspecified whether descended or undescended: Secondary | ICD-10-CM

## 2013-01-17 DIAGNOSIS — Z8547 Personal history of malignant neoplasm of testis: Secondary | ICD-10-CM | POA: Insufficient documentation

## 2013-01-17 MED ORDER — IOHEXOL 300 MG/ML  SOLN
100.0000 mL | Freq: Once | INTRAMUSCULAR | Status: AC | PRN
  Administered 2013-01-17: 100 mL via INTRAVENOUS

## 2013-01-28 ENCOUNTER — Encounter: Payer: Self-pay | Admitting: Vascular Surgery

## 2013-01-29 ENCOUNTER — Encounter: Payer: Self-pay | Admitting: Vascular Surgery

## 2013-01-29 ENCOUNTER — Ambulatory Visit (INDEPENDENT_AMBULATORY_CARE_PROVIDER_SITE_OTHER): Payer: BC Managed Care – PPO | Admitting: Vascular Surgery

## 2013-01-29 VITALS — BP 152/96 | HR 96 | Ht 73.5 in | Wt 197.5 lb

## 2013-01-29 DIAGNOSIS — I714 Abdominal aortic aneurysm, without rupture: Secondary | ICD-10-CM

## 2013-01-29 NOTE — Progress Notes (Signed)
Patient has today for followup of his infrarenal abdominal aortic aneurysm. This was found incidentally with evaluation of testicular cancer. He reports that he has done quite well and has had no new issues regarding his cancer. He had a recent CT scan on 01/17/2013 and I reviewed this and discussed with him today.  Past Medical History  Diagnosis Date  . Hypertension   . Hypertension   . GERD (gastroesophageal reflux disease)   . Hyperlipidemia   . Headache(784.0)   . Cancer     testicular    History  Substance Use Topics  . Smoking status: Current Every Day Smoker -- 1.50 packs/day for 32 years    Types: Cigarettes  . Smokeless tobacco: Never Used     Comment: pt states that he is going to try the E-cigs today 07/10/2012  . Alcohol Use: 30.6 oz/week    48 Cans of beer, 3 Shots of liquor per week    Family History  Problem Relation Age of Onset  . AAA (abdominal aortic aneurysm) Father   . Hyperlipidemia Father     Allergies  Allergen Reactions  . Other Anaphylaxis    Cashews  . Penicillins Anaphylaxis    Current outpatient prescriptions:amLODipine (NORVASC) 5 MG tablet, Take 5 mg by mouth daily., Disp: , Rfl: ;  atorvastatin (LIPITOR) 10 MG tablet, Take 1 tablet by mouth daily., Disp: , Rfl: ;  hydrocodone-acetaminophen (LORCET-HD) 5-500 MG per capsule, Take 1 capsule by mouth every 4 (four) hours as needed for pain., Disp: 30 capsule, Rfl: 0 lisinopril-hydrochlorothiazide (PRINZIDE,ZESTORETIC) 20-25 MG per tablet, Take 1 tablet by mouth daily., Disp: , Rfl: ;  ranitidine (ZANTAC) 150 MG tablet, Take 150 mg by mouth daily as needed., Disp: , Rfl: ;  Aspirin-Salicylamide-Caffeine (BC HEADACHE PO), Take by mouth. Once daily as needed for headaches., Disp: , Rfl:   BP 152/96  Pulse 96  Ht 6' 1.5" (1.867 m)  Wt 197 lb 8 oz (89.585 kg)  BMI 25.7 kg/m2  SpO2 100%  Body mass index is 25.7 kg/(m^2).       Physical exam: Well-nourished white male no acute  distress Abdomen soft nontender no aneurysm is palpable Pulse status 2+ radial and 2+ pedal pulses with no evidence of peripheral aneurysm Neurologically he is grossly intact Skin without ulcers or rashes  CT scan reveals no change from his prior studies. Maximal diameter is 4.6 cm.  Impression and plan stable infrarenal abdominal aortic aneurysm with serial CT scans. He is to continue to followup with urology regarding his testicular cancer he'll have serial CT scans. I am we will see him in one year for continued followup. I again reviewed symptoms of leaking aneurysm in this report immediately should this occur

## 2013-02-26 ENCOUNTER — Ambulatory Visit: Payer: BC Managed Care – PPO | Admitting: Urology

## 2013-03-05 ENCOUNTER — Ambulatory Visit: Payer: BC Managed Care – PPO | Admitting: Vascular Surgery

## 2013-05-21 ENCOUNTER — Ambulatory Visit: Payer: BC Managed Care – PPO | Admitting: Urology

## 2013-05-28 ENCOUNTER — Other Ambulatory Visit: Payer: Self-pay | Admitting: Urology

## 2013-05-28 ENCOUNTER — Ambulatory Visit (INDEPENDENT_AMBULATORY_CARE_PROVIDER_SITE_OTHER): Payer: BC Managed Care – PPO | Admitting: Urology

## 2013-05-28 DIAGNOSIS — C6290 Malignant neoplasm of unspecified testis, unspecified whether descended or undescended: Secondary | ICD-10-CM

## 2013-06-18 ENCOUNTER — Ambulatory Visit (HOSPITAL_COMMUNITY): Payer: BC Managed Care – PPO

## 2013-07-12 ENCOUNTER — Ambulatory Visit (HOSPITAL_COMMUNITY): Payer: BC Managed Care – PPO

## 2013-07-12 ENCOUNTER — Ambulatory Visit (HOSPITAL_COMMUNITY)
Admission: RE | Admit: 2013-07-12 | Discharge: 2013-07-12 | Disposition: A | Discharge status: Home / Self Care | Payer: BC Managed Care – PPO | Source: Ambulatory Visit | Attending: Urology | Admitting: Urology

## 2013-07-12 DIAGNOSIS — C6290 Malignant neoplasm of unspecified testis, unspecified whether descended or undescended: Secondary | ICD-10-CM

## 2013-07-12 DIAGNOSIS — R918 Other nonspecific abnormal finding of lung field: Secondary | ICD-10-CM | POA: Insufficient documentation

## 2013-07-12 DIAGNOSIS — Z9079 Acquired absence of other genital organ(s): Secondary | ICD-10-CM | POA: Insufficient documentation

## 2013-07-12 DIAGNOSIS — I714 Abdominal aortic aneurysm, without rupture, unspecified: Secondary | ICD-10-CM | POA: Insufficient documentation

## 2013-07-12 MED ORDER — IOHEXOL 300 MG/ML  SOLN
100.0000 mL | Freq: Once | INTRAMUSCULAR | Status: AC | PRN
  Administered 2013-07-12: 100 mL via INTRAVENOUS

## 2013-09-24 ENCOUNTER — Ambulatory Visit: Payer: BC Managed Care – PPO | Admitting: Urology

## 2014-01-14 ENCOUNTER — Ambulatory Visit (INDEPENDENT_AMBULATORY_CARE_PROVIDER_SITE_OTHER): Payer: BC Managed Care – PPO | Admitting: Urology

## 2014-01-14 ENCOUNTER — Other Ambulatory Visit: Payer: Self-pay | Admitting: Urology

## 2014-01-14 DIAGNOSIS — C6290 Malignant neoplasm of unspecified testis, unspecified whether descended or undescended: Secondary | ICD-10-CM

## 2014-01-14 DIAGNOSIS — C801 Malignant (primary) neoplasm, unspecified: Secondary | ICD-10-CM

## 2014-01-14 DIAGNOSIS — N529 Male erectile dysfunction, unspecified: Secondary | ICD-10-CM

## 2014-01-14 DIAGNOSIS — I714 Abdominal aortic aneurysm, without rupture, unspecified: Secondary | ICD-10-CM

## 2014-01-28 ENCOUNTER — Ambulatory Visit (HOSPITAL_COMMUNITY)
Admission: RE | Admit: 2014-01-28 | Discharge: 2014-01-28 | Disposition: A | Discharge status: Home / Self Care | Payer: BC Managed Care – PPO | Source: Ambulatory Visit | Attending: Urology | Admitting: Urology

## 2014-01-28 ENCOUNTER — Encounter (HOSPITAL_COMMUNITY): Payer: Self-pay

## 2014-01-28 DIAGNOSIS — C6290 Malignant neoplasm of unspecified testis, unspecified whether descended or undescended: Secondary | ICD-10-CM | POA: Insufficient documentation

## 2014-01-28 DIAGNOSIS — R918 Other nonspecific abnormal finding of lung field: Secondary | ICD-10-CM | POA: Diagnosis not present

## 2014-01-28 DIAGNOSIS — C801 Malignant (primary) neoplasm, unspecified: Secondary | ICD-10-CM

## 2014-01-28 DIAGNOSIS — F17200 Nicotine dependence, unspecified, uncomplicated: Secondary | ICD-10-CM | POA: Insufficient documentation

## 2014-01-28 DIAGNOSIS — I714 Abdominal aortic aneurysm, without rupture, unspecified: Secondary | ICD-10-CM | POA: Insufficient documentation

## 2014-01-28 MED ORDER — IOHEXOL 300 MG/ML  SOLN
100.0000 mL | Freq: Once | INTRAMUSCULAR | Status: AC | PRN
  Administered 2014-01-28: 100 mL via INTRAVENOUS

## 2014-01-28 MED ORDER — SODIUM CHLORIDE 0.9 % IJ SOLN
INTRAMUSCULAR | Status: AC
  Filled 2014-01-28: qty 45

## 2014-01-28 MED ORDER — SODIUM CHLORIDE 0.9 % IJ SOLN
INTRAMUSCULAR | Status: AC
  Filled 2014-01-28: qty 750

## 2014-02-03 ENCOUNTER — Encounter: Payer: Self-pay | Admitting: Vascular Surgery

## 2014-02-04 ENCOUNTER — Ambulatory Visit: Payer: BC Managed Care – PPO | Admitting: Vascular Surgery

## 2014-02-24 ENCOUNTER — Encounter: Payer: Self-pay | Admitting: Vascular Surgery

## 2014-02-25 ENCOUNTER — Encounter: Payer: Self-pay | Admitting: Vascular Surgery

## 2014-02-25 ENCOUNTER — Ambulatory Visit (INDEPENDENT_AMBULATORY_CARE_PROVIDER_SITE_OTHER): Payer: BC Managed Care – PPO | Admitting: Vascular Surgery

## 2014-02-25 VITALS — BP 149/117 | HR 92 | Resp 18 | Ht 73.5 in | Wt 195.0 lb

## 2014-02-25 DIAGNOSIS — I714 Abdominal aortic aneurysm, without rupture, unspecified: Secondary | ICD-10-CM

## 2014-02-25 NOTE — Progress Notes (Signed)
    Established Abdominal Aortic Aneurysm  History of Present Illness  The patient is a 53 y.o. ([birth year]) male who presents with chief complaint: follow up for AAA.  Previous studies demonstrate an AAA, measuring 4.6 cm.  The patient does not have back or abdominal pain.  The patient is a smoker. He is being followed by urology for testicular cancer and is currently having CT scans every 4 months.   The patient's PMH, PSH, SH, FamHx, Med, and Allergies are unchanged from 01/29/13.  On ROS today: he denies any chest pain or shortness of breath. He has some cramping with his legs with walking.   Physical Examination  Filed Vitals:   02/25/14 1444  BP: 149/117  Pulse: 92  Resp: 18  Height: 6' 1.5" (1.867 m)  Weight: 195 lb (88.451 kg)   Body mass index is 25.38 kg/(m^2).  General: A&O x 3, WDWN male in NAD  Pulmonary: Sym exp, good air movt, CTAB, no rales, rhonchi, & wheezing   Cardiac: RRR, Nl S1, S2, no Murmurs, rubs or gallops, no carotid bruits  Vascular: palpable pulsatile AAA, 2+ femoral pulses b/l, 2+ left popliteal pulse. 2+ posterior tibial pulses bilaterally.   Gastrointestinal: soft, NTND, -G/R, - HSM, - masses,  + AAA ,  Musculoskeletal: M/S 5/5 throughout. Extremities without ischemic changes.   Neurologic: CN 2-12 grossly intact.  Pain and light touch intact in extremities. Motor exam as listed above  Non-Invasive Vascular Imaging  AAA Duplex (02/25/2014)  Previous size: 4.7 cm (Date: 07/12/13)  Current size:  4.6 cm (Date: 01/28/2014)  Medical Decision Making  The patient is a 53 y.o. male who presents with: asymptomatic AAA with unchanged size.   Based on this patient's exam and diagnostic studies, he will follow up in one year for follow up of his AAA. He receives CT abdomen/pelvis scans every four months for his testicular cancer. His CT scan reveals that is an endovascular stent candidate.  The threshold for repair is AAA size > 5.5 cm, growth > 1  cm/yr, and symptomatic status.  I emphasized the importance of maximal medical management including strict control of blood pressure, lipid levels, antiplatelet agents and cessation of smoking.  He will start taking a daily 81 mg aspirin. He is currently on norvasc and a statin.   He was advised to seek emergency help if he develops severe abdominal and back pain.   Thank you for allowing [place] to participate in this patient's care.  Virgina Jock, PA-C [facility] [place] Office: [phone] Pager: [phone]  02/25/2014, 3:15 PM   This patient was seen in conjunction with Dr. Donnetta Hutching   I have examined the patient, reviewed and agree with above. Stable aneurysm size. I explained that if his young age of 4.8 cm aneurysm suspect that he will come to repair. Reviewing his CAT scan if this does appear to be acceptable for stent graft with normal infrarenal aortic neck prior to aneurysm beginning he will continue with serial CT scans and we will see him again in one year for further discussion. Again reviewed symptoms of leaking aneurysm and the patient has reported immediately to the emergency room should this occur  EARLY, TODD, MD 02/25/2014 3:44 PM

## 2014-05-20 ENCOUNTER — Ambulatory Visit: Payer: Self-pay | Admitting: Urology

## 2014-10-07 ENCOUNTER — Encounter (HOSPITAL_COMMUNITY): Payer: Self-pay | Admitting: *Deleted

## 2014-10-07 ENCOUNTER — Emergency Department (HOSPITAL_COMMUNITY)
Admission: EM | Admit: 2014-10-07 | Discharge: 2014-10-07 | Disposition: A | Discharge status: Home / Self Care | Payer: 59 | Attending: Emergency Medicine | Admitting: Emergency Medicine

## 2014-10-07 DIAGNOSIS — S61219A Laceration without foreign body of unspecified finger without damage to nail, initial encounter: Secondary | ICD-10-CM

## 2014-10-07 DIAGNOSIS — Z8639 Personal history of other endocrine, nutritional and metabolic disease: Secondary | ICD-10-CM | POA: Diagnosis not present

## 2014-10-07 DIAGNOSIS — S61213A Laceration without foreign body of left middle finger without damage to nail, initial encounter: Secondary | ICD-10-CM | POA: Insufficient documentation

## 2014-10-07 DIAGNOSIS — Y9389 Activity, other specified: Secondary | ICD-10-CM | POA: Insufficient documentation

## 2014-10-07 DIAGNOSIS — Y9289 Other specified places as the place of occurrence of the external cause: Secondary | ICD-10-CM | POA: Diagnosis not present

## 2014-10-07 DIAGNOSIS — W260XXA Contact with knife, initial encounter: Secondary | ICD-10-CM | POA: Insufficient documentation

## 2014-10-07 DIAGNOSIS — Z79899 Other long term (current) drug therapy: Secondary | ICD-10-CM | POA: Insufficient documentation

## 2014-10-07 DIAGNOSIS — Z7982 Long term (current) use of aspirin: Secondary | ICD-10-CM | POA: Insufficient documentation

## 2014-10-07 DIAGNOSIS — I10 Essential (primary) hypertension: Secondary | ICD-10-CM | POA: Diagnosis not present

## 2014-10-07 DIAGNOSIS — Z880 Allergy status to penicillin: Secondary | ICD-10-CM | POA: Diagnosis not present

## 2014-10-07 DIAGNOSIS — K219 Gastro-esophageal reflux disease without esophagitis: Secondary | ICD-10-CM | POA: Diagnosis not present

## 2014-10-07 DIAGNOSIS — Z8547 Personal history of malignant neoplasm of testis: Secondary | ICD-10-CM | POA: Diagnosis not present

## 2014-10-07 DIAGNOSIS — Z720 Tobacco use: Secondary | ICD-10-CM | POA: Diagnosis not present

## 2014-10-07 DIAGNOSIS — Z23 Encounter for immunization: Secondary | ICD-10-CM | POA: Diagnosis not present

## 2014-10-07 DIAGNOSIS — Y998 Other external cause status: Secondary | ICD-10-CM | POA: Insufficient documentation

## 2014-10-07 MED ORDER — LIDOCAINE HCL (PF) 1 % IJ SOLN
INTRAMUSCULAR | Status: AC
  Filled 2014-10-07: qty 5

## 2014-10-07 MED ORDER — TETANUS-DIPHTH-ACELL PERTUSSIS 5-2.5-18.5 LF-MCG/0.5 IM SUSP
0.5000 mL | Freq: Once | INTRAMUSCULAR | Status: AC
  Administered 2014-10-07: 0.5 mL via INTRAMUSCULAR
  Filled 2014-10-07: qty 0.5

## 2014-10-07 NOTE — Discharge Instructions (Signed)
Please cleanse the wounds with soap and water daily. Please update your records that your tetanus status was updated today. Please observe for infection in the wound sites. Please see Dr. Hilma Favors or return to the emergency department if any signs of infection. Laceration Care, Adult A laceration is a cut or lesion that goes through all layers of the skin and into the tissue just beneath the skin. TREATMENT  Some lacerations may not require closure. Some lacerations may not be able to be closed due to an increased risk of infection. It is important to see your caregiver as soon as possible after an injury to minimize the risk of infection and maximize the opportunity for successful closure. If closure is appropriate, pain medicines may be given, if needed. The wound will be cleaned to help prevent infection. Your caregiver will use stitches (sutures), staples, wound glue (adhesive), or skin adhesive strips to repair the laceration. These tools bring the skin edges together to allow for faster healing and a better cosmetic outcome. However, all wounds will heal with a scar. Once the wound has healed, scarring can be minimized by covering the wound with sunscreen during the day for 1 full year. HOME CARE INSTRUCTIONS  For sutures or staples:  Keep the wound clean and dry.  If you were given a bandage (dressing), you should change it at least once a day. Also, change the dressing if it becomes wet or dirty, or as directed by your caregiver.  Wash the wound with soap and water 2 times a day. Rinse the wound off with water to remove all soap. Pat the wound dry with a clean towel.  After cleaning, apply a thin layer of the antibiotic ointment as recommended by your caregiver. This will help prevent infection and keep the dressing from sticking.  You may shower as usual after the first 24 hours. Do not soak the wound in water until the sutures are removed.  Only take over-the-counter or prescription  medicines for pain, discomfort, or fever as directed by your caregiver.  Get your sutures or staples removed as directed by your caregiver. For skin adhesive strips:  Keep the wound clean and dry.  Do not get the skin adhesive strips wet. You may bathe carefully, using caution to keep the wound dry.  If the wound gets wet, pat it dry with a clean towel.  Skin adhesive strips will fall off on their own. You may trim the strips as the wound heals. Do not remove skin adhesive strips that are still stuck to the wound. They will fall off in time. For wound adhesive:  You may briefly wet your wound in the shower or bath. Do not soak or scrub the wound. Do not swim. Avoid periods of heavy perspiration until the skin adhesive has fallen off on its own. After showering or bathing, gently pat the wound dry with a clean towel.  Do not apply liquid medicine, cream medicine, or ointment medicine to your wound while the skin adhesive is in place. This may loosen the film before your wound is healed.  If a dressing is placed over the wound, be careful not to apply tape directly over the skin adhesive. This may cause the adhesive to be pulled off before the wound is healed.  Avoid prolonged exposure to sunlight or tanning lamps while the skin adhesive is in place. Exposure to ultraviolet light in the first year will darken the scar.  The skin adhesive will usually remain in place  for 5 to 10 days, then naturally fall off the skin. Do not pick at the adhesive film. You may need a tetanus shot if:  You cannot remember when you had your last tetanus shot.  You have never had a tetanus shot. If you get a tetanus shot, your arm may swell, get red, and feel warm to the touch. This is common and not a problem. If you need a tetanus shot and you choose not to have one, there is a rare chance of getting tetanus. Sickness from tetanus can be serious. SEEK MEDICAL CARE IF:   You have redness, swelling, or  increasing pain in the wound.  You see a red line that goes away from the wound.  You have yellowish-white fluid (pus) coming from the wound.  You have a fever.  You notice a bad smell coming from the wound or dressing.  Your wound breaks open before or after sutures have been removed.  You notice something coming out of the wound such as wood or glass.  Your wound is on your hand or foot and you cannot move a finger or toe. SEEK IMMEDIATE MEDICAL CARE IF:   Your pain is not controlled with prescribed medicine.  You have severe swelling around the wound causing pain and numbness or a change in color in your arm, hand, leg, or foot.  Your wound splits open and starts bleeding.  You have worsening numbness, weakness, or loss of function of any joint around or beyond the wound.  You develop painful lumps near the wound or on the skin anywhere on your body. MAKE SURE YOU:   Understand these instructions.  Will watch your condition.  Will get help right away if you are not doing well or get worse. Document Released: 04/18/2005 Document Revised: 07/11/2011 Document Reviewed: 10/12/2010 [facility] Patient Information 2015 [place], [place]. This information is not intended to replace advice given to you by your health care provider. Make sure you discuss any questions you have with your health care provider.

## 2014-10-07 NOTE — ED Notes (Signed)
Lac to LMF with serrated knife. Distal phalanx

## 2014-10-07 NOTE — ED Notes (Signed)
Pt was opening a pepper container with a knife and the knife slipped. Pt has a laceration to left middle finger. Bleeding in triage.

## 2014-10-07 NOTE — ED Provider Notes (Signed)
CSN: [id number]     Arrival date & time 10/07/14  2119 History   First MD Initiated Contact with Patient 10/07/14 2227     Chief Complaint  Patient presents with  . Laceration     (Consider location/radiation/quality/duration/timing/severity/associated sxs/prior Treatment) Patient is a 54 y.o. male presenting with skin laceration. The history is provided by the patient.  Laceration Location:  Finger Finger laceration location:  L middle finger Length (cm):  1.8 Depth:  Cutaneous Quality comment:  Deep flap Bleeding: controlled   Time since incident:  1 hour Laceration mechanism:  Knife Pain details:    Severity:  Mild   Timing:  Constant   Progression:  Unchanged Foreign body present:  No foreign bodies Relieved by:  Pressure Worsened by:  Nothing tried Tetanus status:  Out of date   Past Medical History  Diagnosis Date  . Hypertension   . Hypertension   . GERD (gastroesophageal reflux disease)   . Hyperlipidemia   . Headache(784.0)   . Cancer Feb 14    testicular LT   Past Surgical History  Procedure Laterality Date  . Tonsillectomy  1968  . Orchiectomy  06/05/2012    Procedure: ORCHIECTOMY;  Surgeon: Franchot Gallo, MD;  Location: [facility];  Service: Urology;  Laterality: Left;  Left Inguinal Orchiectomy   Family History  Problem Relation Age of Onset  . AAA (abdominal aortic aneurysm) Father   . Hyperlipidemia Father    History  Substance Use Topics  . Smoking status: Current Every Day Smoker -- 1.50 packs/day for 32 years    Types: Cigarettes  . Smokeless tobacco: Never Used     Comment: pt states that he is going to try the E-cigs today 07/10/2012  . Alcohol Use: 30.6 oz/week    48 Cans of beer, 3 Shots of liquor per week    Review of Systems  Skin: Positive for wound.  Neurological: Positive for headaches.  All other systems reviewed and are negative.     Allergies  Other and Penicillins  Home Medications   Prior to Admission medications    Medication Sig Start Date End Date Taking? Authorizing Provider  amLODipine (NORVASC) 5 MG tablet Take 5 mg by mouth daily.   Yes Historical Provider, MD  aspirin 81 MG EC tablet Take 81 mg by mouth daily. Swallow whole.   Yes Historical Provider, MD  Aspirin-Salicylamide-Caffeine (BC HEADACHE PO) Take 1 packet by mouth daily as needed (for pain). Once daily as needed for headaches.   Yes Historical Provider, MD  lisinopril (PRINIVIL,ZESTRIL) 20 MG tablet Take 20 mg by mouth daily.   Yes Historical Provider, MD  ranitidine (ZANTAC) 150 MG tablet Take 150 mg by mouth daily as needed.   Yes Historical Provider, MD  hydrocodone-acetaminophen (LORCET-HD) 5-500 MG per capsule Take 1 capsule by mouth every 4 (four) hours as needed for pain. Patient not taking: Reported on 10/07/2014 06/05/12   Franchot Gallo, MD   BP 164/93 mmHg  Pulse 99  Temp(Src) 98.1 F (36.7 C)  Resp 20  Ht 6' 1.5" (1.867 m)  Wt 195 lb (88.451 kg)  BMI 25.38 kg/m2  SpO2 100% Physical Exam  Constitutional: He is oriented to person, place, and time. He appears well-developed and well-nourished.  Non-toxic appearance.  HENT:  Head: Normocephalic.  Right Ear: Tympanic membrane and external ear normal.  Left Ear: Tympanic membrane and external ear normal.  Eyes: EOM and lids are normal. Pupils are equal, round, and reactive to light.  Neck:  Normal range of motion. Neck supple. Carotid bruit is not present.  Cardiovascular: Normal rate, regular rhythm, normal heart sounds, intact distal pulses and normal pulses.   Pulmonary/Chest: Breath sounds normal. No respiratory distress.  Abdominal: Soft. Bowel sounds are normal. There is no tenderness. There is no guarding.  Musculoskeletal: Normal range of motion.  There is a deep laceration to the palmar surface of the distal left middle finger. Bleeding is controlled. There is a shallow laceration of the palmar lateral ring finger of the left hand with minimal bleeding. There is  full range of motion of all fingers. Capillary refill is less than 2 seconds.  Lymphadenopathy:       Head (right side): No submandibular adenopathy present.       Head (left side): No submandibular adenopathy present.    He has no cervical adenopathy.  Neurological: He is alert and oriented to person, place, and time. He has normal strength. No cranial nerve deficit or sensory deficit.  There no motor or sensory deficits of the right or left upper extremities.  Skin: Skin is warm and dry.  Psychiatric: He has a normal mood and affect. His speech is normal.  Nursing note and vitals reviewed.   ED Course  A Steri-Strip was applied to the laceration of the left middle finger as well as the left ring finger. A Band-Aid was applied to each finger at the patient's request.   Procedures (including critical care time) Labs Review Labs Reviewed - No data to display  Imaging Review No results found.   EKG Interpretation None      MDM  The laceration of the middle finger is a deep flap. I suggested to the patient that he have it repaired with sutures. The patient states that he is uncomfortable with needles, as he has to have needles about every 3 months. He does not want to have this repaired with sutures. We discussed the possibilities of infection, and the delay in healing. The patient accepts the responsibility for not having wound repaired with sutures and again requests that he did not have suture repair. He will receive a tetanus update as his tetanus is out of date at this time. Patient advised to return to the emergency department if any signs of infection or problems. Patient is in agreement with return for those reasons.    Final diagnoses:  None    *I have reviewed nursing notes, vital signs, and all appropriate lab and imaging results for this patient.[address] PA-C 10/10/14 [id number]  Milton Ferguson, MD 10/11/14 1409

## 2015-01-27 ENCOUNTER — Ambulatory Visit (INDEPENDENT_AMBULATORY_CARE_PROVIDER_SITE_OTHER): Payer: 59 | Admitting: Urology

## 2015-01-27 DIAGNOSIS — N5201 Erectile dysfunction due to arterial insufficiency: Secondary | ICD-10-CM | POA: Diagnosis not present

## 2015-01-27 DIAGNOSIS — Z8547 Personal history of malignant neoplasm of testis: Secondary | ICD-10-CM

## 2015-01-27 DIAGNOSIS — I714 Abdominal aortic aneurysm, without rupture: Secondary | ICD-10-CM | POA: Diagnosis not present

## 2015-01-30 ENCOUNTER — Other Ambulatory Visit: Payer: Self-pay | Admitting: Urology

## 2015-01-30 DIAGNOSIS — Z8547 Personal history of malignant neoplasm of testis: Secondary | ICD-10-CM

## 2015-02-11 ENCOUNTER — Ambulatory Visit (HOSPITAL_COMMUNITY)
Admission: RE | Admit: 2015-02-11 | Discharge: 2015-02-11 | Disposition: A | Discharge status: Home / Self Care | Payer: BLUE CROSS/BLUE SHIELD | Source: Ambulatory Visit | Attending: Urology | Admitting: Urology

## 2015-02-11 DIAGNOSIS — I714 Abdominal aortic aneurysm, without rupture: Secondary | ICD-10-CM | POA: Diagnosis not present

## 2015-02-11 DIAGNOSIS — Z8547 Personal history of malignant neoplasm of testis: Secondary | ICD-10-CM | POA: Diagnosis present

## 2015-02-11 DIAGNOSIS — I2510 Atherosclerotic heart disease of native coronary artery without angina pectoris: Secondary | ICD-10-CM | POA: Insufficient documentation

## 2015-02-11 DIAGNOSIS — R918 Other nonspecific abnormal finding of lung field: Secondary | ICD-10-CM | POA: Diagnosis not present

## 2015-02-11 MED ORDER — IOHEXOL 300 MG/ML  SOLN
100.0000 mL | Freq: Once | INTRAMUSCULAR | Status: AC | PRN
  Administered 2015-02-11: 100 mL via INTRAVENOUS

## 2015-02-26 ENCOUNTER — Encounter: Payer: Self-pay | Admitting: Vascular Surgery

## 2015-03-03 ENCOUNTER — Ambulatory Visit: Payer: BC Managed Care – PPO | Admitting: Vascular Surgery

## 2015-03-12 ENCOUNTER — Encounter: Payer: Self-pay | Admitting: Vascular Surgery

## 2015-03-17 ENCOUNTER — Encounter: Payer: Self-pay | Admitting: Vascular Surgery

## 2015-03-17 ENCOUNTER — Ambulatory Visit (INDEPENDENT_AMBULATORY_CARE_PROVIDER_SITE_OTHER): Payer: BLUE CROSS/BLUE SHIELD | Admitting: Vascular Surgery

## 2015-03-17 VITALS — BP 158/114 | HR 93 | Ht 73.5 in | Wt 202.0 lb

## 2015-03-17 DIAGNOSIS — I714 Abdominal aortic aneurysm, without rupture, unspecified: Secondary | ICD-10-CM

## 2015-03-17 NOTE — Addendum Note (Signed)
Addended by: Dorthula Rue L on: 03/17/2015 04:01 PM   Modules accepted: Orders

## 2015-03-17 NOTE — Progress Notes (Signed)
Vascular and Vein Specialist of [facility]  Patient name: Shane Faulkner MRN: [id number] DOB: 11/14/1960 Sex: male  REASON FOR VISIT: Follow-up of abdominal aortic aneurysm  HPI: Shane Faulkner is a 54 y.o. male seen today for follow-up of his abdominal aortic aneurysm. He has a history of aneurysm repair in his father. This was discovered at the time of workup for testicular cancer. He recently had a CT scan for continued postoperative follow-up of testicular cancer and also had the visualization of his aneurysm. I have reviewed these this CT scan as well. He reports that he has had no evidence of recurrent cancer. He has no symptoms referable to his aneurysm. Continues to be in otherwise excellent.  Past Medical History  Diagnosis Date  . Hypertension   . Hypertension   . GERD (gastroesophageal reflux disease)   . Hyperlipidemia   . Headache(784.0)   . Cancer [facility]) Feb 14    testicular LT    Family History  Problem Relation Age of Onset  . AAA (abdominal aortic aneurysm) Father   . Hyperlipidemia Father     SOCIAL HISTORY: Social History  Substance Use Topics  . Smoking status: Current Every Day Smoker -- 1.50 packs/day for 32 years    Types: Cigarettes  . Smokeless tobacco: Never Used     Comment: pt states that he is going to try the E-cigs today 07/10/2012  . Alcohol Use: 30.6 oz/week    48 Cans of beer, 3 Shots of liquor per week    Allergies  Allergen Reactions  . Other Anaphylaxis    Cashews  . Penicillins Anaphylaxis    Current Outpatient Prescriptions  Medication Sig Dispense Refill  . amLODipine (NORVASC) 5 MG tablet Take 5 mg by mouth daily.    Marland Kitchen aspirin 81 MG EC tablet Take 81 mg by mouth daily. Swallow whole.    . Aspirin-Salicylamide-Caffeine (BC HEADACHE PO) Take 1 packet by mouth daily as needed (for pain). Once daily as needed for headaches.    Marland Kitchen lisinopril (PRINIVIL,ZESTRIL) 20 MG tablet Take 30 mg by mouth daily.     . ranitidine (ZANTAC)  150 MG tablet Take 150 mg by mouth daily as needed.    . hydrocodone-acetaminophen (LORCET-HD) 5-500 MG per capsule Take 1 capsule by mouth every 4 (four) hours as needed for pain. (Patient not taking: Reported on 10/07/2014) 30 capsule 0   No current facility-administered medications for this visit.    REVIEW OF SYSTEMS:  [X]  denotes positive finding, [ ]  denotes negative finding Cardiac  Comments:  Chest pain or chest pressure:    Shortness of breath upon exertion:    Short of breath when lying flat:    Irregular heart rhythm:        Vascular    Pain in calf, thigh, or hip brought on by ambulation:    Pain in feet at night that wakes you up from your sleep:     Blood clot in your veins:    Leg swelling:         Pulmonary    Oxygen at home:    Productive cough:     Wheezing:         Neurologic    Sudden weakness in arms or legs:     Sudden numbness in arms or legs:     Sudden onset of difficulty speaking or slurred speech:    Temporary loss of vision in one eye:     Problems with dizziness:  Gastrointestinal    Blood in stool:     Vomited blood:         Genitourinary    Burning when urinating:     Blood in urine:        Psychiatric    Major depression:         Hematologic    Bleeding problems:    Problems with blood clotting too easily:        Skin    Rashes or ulcers:        Constitutional    Fever or chills:      PHYSICAL EXAM: Filed Vitals:   03/17/15 1202 03/17/15 1204  BP: 155/115 158/114  Pulse: 93   Height: 6' 1.5" (1.867 m)   Weight: 202 lb (91.627 kg)   SpO2: 99%     GENERAL: The patient is a well-nourished male, in no acute distress. The vital signs are documented above. CARDIAC: There is a regular rate and rhythm.  VASCULAR: 2+ radial 2+ dorsalis pedis pulses bilaterally PULMONARY: There is good air exchange bilaterally without wheezing or rales. ABDOMEN: Soft and non-tender with normal pitched bowel sounds. Moderate obesity and a  do not palpate his aneurysm MUSCULOSKELETAL: There are no major deformities or cyanosis. NEUROLOGIC: No focal weakness or paresthesias are detected. SKIN: There are no ulcers or rashes noted. PSYCHIATRIC: The patient has a normal affect.  DATA:  Recent CT scan shows maximal diameter of 5.0 cm which is slightly larger since prior study of 4.8 cm  MEDICAL ISSUES: I discussed options with patient and his wife present. Explained that at his age of 71 and otherwise good health and all likelihood he will reach a point where repair is recommended. Explained that his rupture risk is less than 5% currently with his 5.0 cm aneurysm. I explained that his young age and otherwise good health and it would not be unreasonable to consider elective repair currently but certainly do not feel that this offers him any significant statistical benefit over watching for another 6 months. He is comfortable with this and will see [place] again in 6 months with repeat ultrasound for follow-up. If he has a CT scan for follow-up of his testicular cancer in that same timeframe he will call us and cancel the ultrasound and see me with CT scan [place] instead. Discussed symptoms of leaking aneurysm and he knows to call 911 or immediately transported to the hospital should this occur No Follow-up on file.   Curt Jews [facility] [place]: [phone]

## 2015-05-06 ENCOUNTER — Encounter (INDEPENDENT_AMBULATORY_CARE_PROVIDER_SITE_OTHER): Payer: Self-pay | Admitting: *Deleted

## 2015-06-08 ENCOUNTER — Encounter (INDEPENDENT_AMBULATORY_CARE_PROVIDER_SITE_OTHER): Payer: Self-pay | Admitting: Internal Medicine

## 2015-06-08 ENCOUNTER — Ambulatory Visit (INDEPENDENT_AMBULATORY_CARE_PROVIDER_SITE_OTHER): Payer: BLUE CROSS/BLUE SHIELD | Admitting: Internal Medicine

## 2015-09-22 ENCOUNTER — Ambulatory Visit (HOSPITAL_COMMUNITY)
Admission: RE | Admit: 2015-09-22 | Discharge: 2015-09-22 | Disposition: A | Discharge status: Home / Self Care | Payer: BLUE CROSS/BLUE SHIELD | Source: Ambulatory Visit | Attending: Vascular Surgery | Admitting: Vascular Surgery

## 2015-09-22 ENCOUNTER — Ambulatory Visit: Payer: BLUE CROSS/BLUE SHIELD | Admitting: Vascular Surgery

## 2015-09-22 DIAGNOSIS — I714 Abdominal aortic aneurysm, without rupture, unspecified: Secondary | ICD-10-CM

## 2015-09-23 ENCOUNTER — Encounter: Payer: Self-pay | Admitting: Vascular Surgery

## 2015-09-30 ENCOUNTER — Encounter: Payer: Self-pay | Admitting: Vascular Surgery

## 2015-09-30 ENCOUNTER — Ambulatory Visit (INDEPENDENT_AMBULATORY_CARE_PROVIDER_SITE_OTHER): Payer: BLUE CROSS/BLUE SHIELD | Admitting: Vascular Surgery

## 2015-09-30 VITALS — BP 153/96 | HR 97 | Ht 73.5 in | Wt 195.0 lb

## 2015-09-30 DIAGNOSIS — Z01810 Encounter for preprocedural cardiovascular examination: Secondary | ICD-10-CM | POA: Diagnosis not present

## 2015-09-30 DIAGNOSIS — I714 Abdominal aortic aneurysm, without rupture, unspecified: Secondary | ICD-10-CM

## 2015-09-30 NOTE — Progress Notes (Signed)
Vascular and Vein Specialist of [facility]  Patient name: Shane Faulkner MRN: [id number] DOB: January 28, 1961 Sex: male  REASON FOR VISIT: Follow-up of AAA  HPI: Shane Faulkner is a 55 y.o. male who presents for follow-up of his abdominal aortic aneurysm. At his last office visit in November 2016, his AAA had a maximum diameter of 5 cm on CT scan. He was offered elective repair versus continued observation. He opted for observation.  He denies any abdominal pain. He has no history of coronary artery disease. He does have has a history of hypertension managed on amlodipine and lisinopril. He takes a daily aspirin. He is a current smoker. He is active.   Past Medical History  Diagnosis Date  . Hypertension   . Hypertension   . GERD (gastroesophageal reflux disease)   . Hyperlipidemia   . Headache(784.0)   . Cancer [facility]) Feb 14    testicular LT    Family History  Problem Relation Age of Onset  . AAA (abdominal aortic aneurysm) Father   . Hyperlipidemia Father     SOCIAL HISTORY: Social History  Substance Use Topics  . Smoking status: Current Every Day Smoker -- 1.50 packs/day for 32 years    Types: Cigarettes  . Smokeless tobacco: Never Used  . Alcohol Use: 30.6 oz/week    48 Cans of beer, 3 Shots of liquor per week    Allergies  Allergen Reactions  . Other Anaphylaxis    Cashews  . Penicillins Anaphylaxis    Current Outpatient Prescriptions  Medication Sig Dispense Refill  . amLODipine (NORVASC) 5 MG tablet Take 5 mg by mouth daily.    Marland Kitchen aspirin 81 MG EC tablet Take 81 mg by mouth daily. Swallow whole.    . Aspirin-Salicylamide-Caffeine (BC HEADACHE PO) Take 1 packet by mouth daily as needed (for pain). Once daily as needed for headaches.    Marland Kitchen lisinopril (PRINIVIL,ZESTRIL) 20 MG tablet Take 30 mg by mouth daily.     . ranitidine (ZANTAC) 150 MG tablet Take 150 mg by mouth daily as needed.    . Sertraline HCl (ZOLOFT PO) Take by mouth.    . hydrocodone-acetaminophen  (LORCET-HD) 5-500 MG per capsule Take 1 capsule by mouth every 4 (four) hours as needed for pain. (Patient not taking: Reported on 10/07/2014) 30 capsule 0   No current facility-administered medications for this visit.    REVIEW OF SYSTEMS:  [X]  denotes positive finding, [ ]  denotes negative finding Cardiac  Comments:  Chest pain or chest pressure:    Shortness of breath upon exertion:    Short of breath when lying flat:    Irregular heart rhythm:        Vascular    Pain in calf, thigh, or hip brought on by ambulation:    Pain in feet at night that wakes you up from your sleep:     Blood clot in your veins:    Leg swelling:         Pulmonary    Oxygen at home:    Productive cough:     Wheezing:         Neurologic    Sudden weakness in arms or legs:     Sudden numbness in arms or legs:     Sudden onset of difficulty speaking or slurred speech:    Temporary loss of vision in one eye:     Problems with dizziness:         Gastrointestinal  Blood in stool:     Vomited blood:         Genitourinary    Burning when urinating:     Blood in urine:        Psychiatric    Major depression:         Hematologic    Bleeding problems:    Problems with blood clotting too easily:        Skin    Rashes or ulcers:        Constitutional    Fever or chills:      PHYSICAL EXAM: Filed Vitals:   09/30/15 1453 09/30/15 1457  BP: 165/107 153/96  Pulse: 97   Height: 6' 1.5" (1.867 m)   Weight: 195 lb (88.451 kg)   SpO2: 97%     GENERAL: The patient is a well-nourished male, in no acute distress. The vital signs are documented above. VASCULAR: 2+ femoral and dorsalis pedis pulses bilaterally. PULMONARY: Nonlabored respiratory effort. ABDOMEN: Soft and non-tender. MUSCULOSKELETAL: There are no major deformities or cyanosis. NEUROLOGIC: No focal weakness or paresthesias are detected. SKIN: There are no ulcers or rashes noted. PSYCHIATRIC: The patient has a normal  affect.  DATA:  AAA duplex 09/22/2015  Maximum diameter at mid aorta is 5.0 cm x 5.4 cm.  Previously, 5.0 cm on CT scan 02/11/2015.  MEDICAL ISSUES: Abdominal aortic aneurysm without rupture  The patient's infrarenal abdominal aortic aneurysm has increased in size from 5 cm in October 2016 to 5.4 cm on duplex today. Recommended elective repair. The patient appears to be a good candidate for endovascular stent graft repair. The patient is agreeable to proceed. We will need to obtain a new CTA for preoperative planning. He will also be sent for preoperative cardiac evaluation. The patient wants to complete FMLA forms for his work prior to proceeding with preoperative workup. He will be in touch when he is ready.  Virgina Jock, PA-C [facility] [place]     I have examined the patient, reviewed and agree with above. Long discussion with patient. It 31-year-old with greater than 5 cm aneurysm. Recommended elective repair. He will coordinate things around his job. We will obtain a cardiac evaluation preoperatively and plan stent graft. Will need to repeat thin cut CT angiogram for stent graft planning.  Curt Jews, MD 09/30/2015 4:34 PM

## 2015-10-01 NOTE — Addendum Note (Signed)
Addended by: Mena Goes on: 10/01/2015 02:24 PM   Modules accepted: Orders

## 2015-10-05 ENCOUNTER — Telehealth: Payer: Self-pay | Admitting: Vascular Surgery

## 2015-10-05 NOTE — Addendum Note (Signed)
Addended by: Thresa Ross C on: 10/05/2015 10:51 AM   Modules accepted: Orders

## 2015-10-05 NOTE — Telephone Encounter (Signed)
Left VM for pt re: appointment: CTA 10/08/15 at 4:45pm at [facility]  Appointment with Dr. Domenic Polite for cardiac clearance on 10/13/15 at 8:40 am.

## 2015-10-08 ENCOUNTER — Ambulatory Visit (HOSPITAL_COMMUNITY): Payer: BLUE CROSS/BLUE SHIELD

## 2015-10-12 ENCOUNTER — Encounter: Payer: Self-pay | Admitting: Cardiology

## 2015-10-13 ENCOUNTER — Ambulatory Visit (INDEPENDENT_AMBULATORY_CARE_PROVIDER_SITE_OTHER): Payer: BLUE CROSS/BLUE SHIELD | Admitting: Cardiology

## 2015-10-13 ENCOUNTER — Encounter: Payer: Self-pay | Admitting: Cardiology

## 2015-10-13 VITALS — BP 170/110 | HR 94 | Ht 73.0 in | Wt 191.0 lb

## 2015-10-13 DIAGNOSIS — I714 Abdominal aortic aneurysm, without rupture, unspecified: Secondary | ICD-10-CM

## 2015-10-13 DIAGNOSIS — I10 Essential (primary) hypertension: Secondary | ICD-10-CM | POA: Diagnosis not present

## 2015-10-13 DIAGNOSIS — Z720 Tobacco use: Secondary | ICD-10-CM

## 2015-10-13 DIAGNOSIS — Z01810 Encounter for preprocedural cardiovascular examination: Secondary | ICD-10-CM

## 2015-10-13 NOTE — Progress Notes (Signed)
Cardiology Office Note  Date: 10/13/2015   ID: Shane Faulkner, DOB 1960-11-13, MRN [id number]  PCP: Purvis Kilts, MD  Consulting Cardiologist: Rozann Lesches, MD   Chief Complaint  Patient presents with  . Preoperative evaluation    History of Present Illness: Shane Faulkner is a 55 y.o. male referred for cardiology consultation by Dr. Donnetta Hutching. He has an infrarenal abdominal aortic aneurysm that has increased from 5 cm in October 2016 up to 5.4 cm recently. Plan is for elective repair, likely endovascular stent graft repair.He does not have any abdominal pain or obvious symptoms.  From a cardiac perspective, he has no history of obstructive CAD or myocardial infarction. He works at a [employer] in [place], states that he has very physical activity at times including lifting and transferring 50 pound weight, walking and pushing items. With this level of activity he does not report any exertional chest pain or limiting shortness of breath.  Prior chest CT imaging from October of last year did demonstrate atherosclerotic plaque within the coronary arteries. I did discuss this with him today. His current level of activity without symptoms however would argue against any significant obstructive disease. I reviewed his ECG today which is normal.  Blood pressure elevated, he reports that this is been a problem for some time. He states that he is compliant with both Norvasc and lisinopril. He follows with Dr. Hilma Favors for blood pressure management, lisinopril was last increased to 30 mg daily.  Past Medical History  Diagnosis Date  . Essential hypertension   . GERD (gastroesophageal reflux disease)   . Hyperlipidemia   . Headache(784.0)   . Left testicular cancer [facility]) Feb 2014  . AAA (abdominal aortic aneurysm) ([place])     Followed by Dr. Donnetta Hutching    Past Surgical History  Procedure Laterality Date  . Tonsillectomy  1968  . Orchiectomy  06/05/2012    Procedure:  ORCHIECTOMY;  Surgeon: Franchot Gallo, MD;  Location: [facility];  Service: Urology;  Laterality: Left;  Left Inguinal Orchiectomy    Current Outpatient Prescriptions  Medication Sig Dispense Refill  . amLODipine (NORVASC) 5 MG tablet Take 5 mg by mouth daily.    Marland Kitchen aspirin 81 MG EC tablet Take 81 mg by mouth daily. Swallow whole.    . lisinopril (PRINIVIL,ZESTRIL) 20 MG tablet Take 30 mg by mouth daily.     . pantoprazole (PROTONIX) 40 MG tablet Take 40 mg by mouth daily.    . Sertraline HCl (ZOLOFT PO) Take by mouth.     No current facility-administered medications for this visit.   Allergies:  Other and Penicillins   Social History: The patient  reports that he has been smoking Cigarettes.  He has a 48 pack-year smoking history. He has never used smokeless tobacco. He reports that he drinks about 30.6 oz of alcohol per week. He reports that he does not use illicit drugs.   Family History: The patient's family history includes AAA (abdominal aortic aneurysm) in his father; Hyperlipidemia in his father.   ROS:  Please see the history of present illness. Otherwise, complete review of systems is positive for none.  All other systems are reviewed and negative.   Physical Exam: VS:  BP 170/110 mmHg  Pulse 94  Ht 6\' 1"  (1.854 m)  Wt 191 lb (86.637 kg)  BMI 25.20 kg/m2  SpO2 98%, BMI Body mass index is 25.2 kg/(m^2).  Wt Readings from Last 3 Encounters:  10/13/15 191 lb (86.637  kg)  09/30/15 195 lb (88.451 kg)  03/17/15 202 lb (91.627 kg)    General: Patient appears comfortable at rest. HEENT: Conjunctiva and lids normal, oropharynx clear. Neck: Supple, no elevated JVP or carotid bruits, no thyromegaly. Lungs: Clear to auscultation, nonlabored breathing at rest. Cardiac: Regular rate and rhythm, S4, no significant systolic murmur, no pericardial rub. Abdomen: Soft, nontender, bowel sounds present, no guarding or rebound. Extremities: No pitting edema, distal pulses 2+. Skin: Warm  and dry. Musculoskeletal: No kyphosis. Neuropsychiatric: Alert and oriented x3, affect grossly appropriate.  ECG: I personally reviewed the prior tracing from 06/04/2012 which showed normal sinus rhythm.  Other Studies Reviewed Today:  Abdominal aortic duplex 09/22/2015: Mid to distal abdominal aortic dilatation with thrombus associated measuring 5.0 cm x 5.4 cm.  Assessment and Plan:  1. Preoperative evaluation in a 55 year old male with hypertension and tobacco abuse. He is being considered for elective stent graft repair of an infrarenal abdominal aortic aneurysm per Dr. Donnetta Hutching. He has documented coronary atherosclerosis by chest CT imaging, but normal ECG and no angina symptoms with activities far exceeding 4 METs based on description. Based on this he should be able to proceed without further cardiac testing an acceptable perioperative cardiac risk. He does need better blood pressure control and I have recommended that he increase his lisinopril to 40 mg daily and follow with Dr. Hilma Favors to see if Norvasc needs to be further up titrated as well. He should also have follow-up lab work including BMET.  2. Infrarenal abdominal aortic aneurysm with largest diameter 5.4 cm. Follow-up CT imaging is pending per Dr. Donnetta Hutching with plan for endovascular stent repair.  3. Ongoing tobacco abuse. Smoking cessation will be important long-term.  4. Reported history of hyperlipidemia, currently not on statin therapy. He follows with Dr. Hilma Favors.  Current medicines were reviewed with the patient today.   Orders Placed This Encounter  Procedures  . EKG 12-Lead    Disposition: FU with me as needed.   Signed, Satira Sark, MD, [facility] 10/13/2015 9:08 AM    [place] at [facility] [address], [place], [place] [address] Phone: [phone]; Fax: [phone]

## 2015-10-13 NOTE — Patient Instructions (Signed)
Your physician recommends that you schedule a follow-up appointment in: as needed     Your physician recommends that you continue on your current medications as directed. Please refer to the Current Medication list given to you today.   Thank you for choosing [facility] !         

## 2015-10-22 ENCOUNTER — Ambulatory Visit (HOSPITAL_COMMUNITY)
Admission: RE | Admit: 2015-10-22 | Discharge: 2015-10-22 | Disposition: A | Discharge status: Home / Self Care | Payer: BLUE CROSS/BLUE SHIELD | Source: Ambulatory Visit | Attending: Vascular Surgery | Admitting: Vascular Surgery

## 2015-10-22 DIAGNOSIS — I714 Abdominal aortic aneurysm, without rupture, unspecified: Secondary | ICD-10-CM

## 2015-10-23 ENCOUNTER — Telehealth: Payer: Self-pay | Admitting: Vascular Surgery

## 2015-10-23 ENCOUNTER — Other Ambulatory Visit: Payer: Self-pay | Admitting: Vascular Surgery

## 2015-10-23 DIAGNOSIS — I714 Abdominal aortic aneurysm, without rupture, unspecified: Secondary | ICD-10-CM

## 2015-10-23 NOTE — Telephone Encounter (Signed)
Resched CTA to 6/27 at 4:00 at [facility]. Spoke to pt to inform him of appt.

## 2015-10-23 NOTE — Telephone Encounter (Signed)
-----   Message from Denman George, RN sent at 10/23/2015 11:09 AM EDT ----- Regarding: needs CTA abd/pelvis rescheduled Contact: [phone] This pt. Wasn't able to have the CTA completed at [facility] 6/22, due to "veins collapsed".  After discussing the importance of hydrating the day before procedure, he wants to reschedule, in hopes they can get the IV started.  He prefers an appt. After 3:30 PM, @ [facility], toward the end of next week.  Can you reschedule this please?

## 2015-10-27 ENCOUNTER — Ambulatory Visit (HOSPITAL_COMMUNITY)
Admission: RE | Admit: 2015-10-27 | Discharge: 2015-10-27 | Disposition: A | Discharge status: Home / Self Care | Payer: BLUE CROSS/BLUE SHIELD | Source: Ambulatory Visit | Attending: Vascular Surgery | Admitting: Vascular Surgery

## 2015-10-27 DIAGNOSIS — I714 Abdominal aortic aneurysm, without rupture, unspecified: Secondary | ICD-10-CM

## 2015-10-27 MED ORDER — IOPAMIDOL (ISOVUE-370) INJECTION 76%
100.0000 mL | Freq: Once | INTRAVENOUS | Status: AC | PRN
  Administered 2015-10-27: 100 mL via INTRAVENOUS

## 2015-11-09 ENCOUNTER — Other Ambulatory Visit: Payer: Self-pay

## 2015-11-18 ENCOUNTER — Other Ambulatory Visit (HOSPITAL_COMMUNITY): Payer: Self-pay | Admitting: *Deleted

## 2015-11-18 ENCOUNTER — Encounter (HOSPITAL_COMMUNITY): Payer: Self-pay

## 2015-11-18 ENCOUNTER — Encounter (HOSPITAL_COMMUNITY)
Admission: RE | Admit: 2015-11-18 | Discharge: 2015-11-18 | Disposition: A | Discharge status: Home / Self Care | Payer: BLUE CROSS/BLUE SHIELD | Source: Ambulatory Visit | Attending: Vascular Surgery | Admitting: Vascular Surgery

## 2015-11-18 DIAGNOSIS — F17200 Nicotine dependence, unspecified, uncomplicated: Secondary | ICD-10-CM | POA: Insufficient documentation

## 2015-11-18 DIAGNOSIS — K219 Gastro-esophageal reflux disease without esophagitis: Secondary | ICD-10-CM | POA: Diagnosis not present

## 2015-11-18 DIAGNOSIS — Z0183 Encounter for blood typing: Secondary | ICD-10-CM | POA: Insufficient documentation

## 2015-11-18 DIAGNOSIS — I10 Essential (primary) hypertension: Secondary | ICD-10-CM | POA: Insufficient documentation

## 2015-11-18 DIAGNOSIS — I714 Abdominal aortic aneurysm, without rupture: Secondary | ICD-10-CM | POA: Diagnosis not present

## 2015-11-18 DIAGNOSIS — Z79899 Other long term (current) drug therapy: Secondary | ICD-10-CM | POA: Diagnosis not present

## 2015-11-18 DIAGNOSIS — Z7982 Long term (current) use of aspirin: Secondary | ICD-10-CM | POA: Insufficient documentation

## 2015-11-18 DIAGNOSIS — Z01818 Encounter for other preprocedural examination: Secondary | ICD-10-CM | POA: Diagnosis present

## 2015-11-18 DIAGNOSIS — Z8547 Personal history of malignant neoplasm of testis: Secondary | ICD-10-CM | POA: Diagnosis not present

## 2015-11-18 DIAGNOSIS — Z01812 Encounter for preprocedural laboratory examination: Secondary | ICD-10-CM | POA: Diagnosis not present

## 2015-11-18 DIAGNOSIS — F419 Anxiety disorder, unspecified: Secondary | ICD-10-CM | POA: Insufficient documentation

## 2015-11-18 DIAGNOSIS — Z9079 Acquired absence of other genital organ(s): Secondary | ICD-10-CM | POA: Diagnosis not present

## 2015-11-18 DIAGNOSIS — E785 Hyperlipidemia, unspecified: Secondary | ICD-10-CM | POA: Diagnosis not present

## 2015-11-18 HISTORY — DX: Anxiety disorder, unspecified: F41.9

## 2015-11-18 LAB — PROTIME-INR
INR: 1.03 (ref 0.00–1.49)
PROTHROMBIN TIME: 13.7 s (ref 11.6–15.2)

## 2015-11-18 LAB — COMPREHENSIVE METABOLIC PANEL
ALBUMIN: 3.8 g/dL (ref 3.5–5.0)
ALT: 20 U/L (ref 17–63)
AST: 24 U/L (ref 15–41)
Alkaline Phosphatase: 77 U/L (ref 38–126)
Anion gap: 9 (ref 5–15)
BUN: 12 mg/dL (ref 6–20)
CHLORIDE: 99 mmol/L — AB (ref 101–111)
CO2: 22 mmol/L (ref 22–32)
CREATININE: 1.1 mg/dL (ref 0.61–1.24)
Calcium: 8.8 mg/dL — ABNORMAL LOW (ref 8.9–10.3)
GFR calc Af Amer: >60 mL/min (ref >60)
GFR calc non Af Amer: >60 mL/min (ref >60)
Glucose, Bld: 135 mg/dL — ABNORMAL HIGH (ref 65–99)
POTASSIUM: 3.4 mmol/L — AB (ref 3.5–5.1)
SODIUM: 130 mmol/L — AB (ref 135–145)
Total Bilirubin: 0.8 mg/dL (ref 0.3–1.2)
Total Protein: 6.8 g/dL (ref 6.5–8.1)

## 2015-11-18 LAB — CBC
HCT: 50.1 % (ref 39.0–52.0)
Hemoglobin: 16.9 g/dL (ref 13.0–17.0)
MCH: 31.8 pg (ref 26.0–34.0)
MCHC: 33.7 g/dL (ref 30.0–36.0)
MCV: 94.4 fL (ref 78.0–100.0)
PLATELETS: 265 10*3/uL (ref 150–400)
RBC: 5.31 MIL/uL (ref 4.22–5.81)
RDW: 15.5 % (ref 11.5–15.5)
WBC: 8.7 10*3/uL (ref 4.0–10.5)

## 2015-11-18 LAB — URINALYSIS, ROUTINE W REFLEX MICROSCOPIC
BILIRUBIN URINE: NEGATIVE
GLUCOSE, UA: NEGATIVE mg/dL
KETONES UR: NEGATIVE mg/dL
LEUKOCYTES UA: NEGATIVE
NITRITE: NEGATIVE
PH: 6 (ref 5.0–8.0)
PROTEIN: NEGATIVE mg/dL
Specific Gravity, Urine: 1.008 (ref 1.005–1.030)

## 2015-11-18 LAB — BLOOD GAS, ARTERIAL
ACID-BASE EXCESS: 1.7 mmol/L (ref 0.0–2.0)
BICARBONATE: 25 meq/L — AB (ref 20.0–24.0)
DRAWN BY: 206361
FIO2: 0.21
O2 SAT: 97.7 %
PATIENT TEMPERATURE: 98.6
PH ART: 7.479 — AB (ref 7.350–7.450)
TCO2: 26.1 mmol/L (ref 0–100)
pCO2 arterial: 34.1 mmHg — ABNORMAL LOW (ref 35.0–45.0)
pO2, Arterial: 93.2 mmHg (ref 80.0–100.0)

## 2015-11-18 LAB — APTT: aPTT: 35 seconds (ref 24–37)

## 2015-11-18 LAB — URINE MICROSCOPIC-ADD ON

## 2015-11-18 LAB — TYPE AND SCREEN
ABO/RH(D): A POS
ANTIBODY SCREEN: NEGATIVE

## 2015-11-18 LAB — SURGICAL PCR SCREEN
MRSA, PCR: NEGATIVE
Staphylococcus aureus: NEGATIVE

## 2015-11-18 LAB — ABO/RH: ABO/RH(D): A POS

## 2015-11-18 NOTE — Progress Notes (Signed)
Pt with history of hypertension. Pre admit BP 182/110. States he did not take his morning blood pressure pill and he will go home and take it now.  Denies any other cardiac history or cardiac testing.  Cardiac clearance in EPIC from Dr Rozann Lesches 10/13/15  1.5 ppd smoker. I did instruct him to abstain from smoking for 24 hours prior to surgery. Also states he drinks at least a case of beer a week. Cmet has been ordered.  PCP Dr Hilma Favors at [facility] although he states he is uncertain as to when his last visit was.

## 2015-11-18 NOTE — Pre-Procedure Instructions (Signed)
    MARKEVIOUS ZALOUDEK  11/18/2015      [place], [place] [id number] [place] [id number] Phone: [phone] Fax: [phone]    Your procedure is scheduled on Monday July 24th  Report to [facility] Admitting at 5:30 a.m.  Call this number if you have problems the morning of surgery:  [phone]   Remember:  Do not eat food or drink liquids after midnight.  Take these medicines the morning of surgery with A SIP OF WATER: amlodipine (norvasc), omeprazole (prilosec) if needed, Pantoprazole (protonix) Stop taking Aspirin or aspirin containing products. Stop any Nsaids (such as ibuprofen, naproxen, aleve, advil, motrin), vitamins or herbal products   Do not wear jewelry.  Do not wear lotions, powders, or cologne.  You may wear not deoderant.  Men may shave face and neck.   Do not bring valuables to the hospital.  [facility] is not responsible for any belongings or valuables.  Contacts, dentures or bridgework may not be worn into surgery.  Leave your suitcase in the car.  After surgery it may be brought to your room.  For patients admitted to the hospital, discharge time will be determined by your treatment team.  Special instructions:  Shower with CHG the night before and morning of surgery as instructed  Please read over the following fact sheets that you were given. MRSA Information,shower instructions

## 2015-11-19 ENCOUNTER — Inpatient Hospital Stay (HOSPITAL_COMMUNITY): Payer: BLUE CROSS/BLUE SHIELD | Admitting: Anesthesiology

## 2015-11-19 ENCOUNTER — Inpatient Hospital Stay (HOSPITAL_COMMUNITY): Payer: BLUE CROSS/BLUE SHIELD | Admitting: Vascular Surgery

## 2015-11-19 NOTE — Progress Notes (Addendum)
Anesthesia Chart Review: Patient is a 55 year old male scheduled for EVAR AAA on 11/23/15 by Dr. Donnetta Hutching.  History includes smoking, HTN, GERD, HLD, AAA, anxiety, headaches, left testicular cancer s/p left orchiectomy '14, tonsillectomy '68. PCP is Dr. Hilma Favors with [facility] in [place], reportedly last visit with Collene Mares, PA-C about 6 weeks ago.   He was referred to cardiologist Dr. Domenic Polite for pre-operative clearance. Per 10/13/15 office visit: "1. Preoperative evaluation in a 55 year old male with hypertension and tobacco abuse. He is being considered for elective stent graft repair of an infrarenal abdominal aortic aneurysm per Dr. Donnetta Hutching. He has documented coronary atherosclerosis by chest CT imaging, but normal ECG and no angina symptoms with activities far exceeding 4 METs based on description. Based on this he should be able to proceed without further cardiac testing an acceptable perioperative cardiac risk. He does need better blood pressure control and I have recommended that he increase his lisinopril to 40 mg daily and follow with Dr. Hilma Favors to see if Norvasc needs to be further up titrated as well. He should also have follow-up lab work including BMET."  Meds include amlodipine 5 mg Q PM, ASA 81mg  Q AM, lisinopril 40 mg Q AM (increased from 20 mg on 10/13/15), omeprazole OTC PRN, pantoprazole, sertraline.   PAT Vitals: BP 182/110, HR 99, RR 20, T 37C, O2 sat 99%. BMI 25.48. It does not appear that BP was rechecked at PAT; however nursing notes indicate that patient reported he had not taken lisinopril that morning (he had taken amlodipine the night before) but would do so once he returned home--PAT was after 2 PM. (BP was 170/110 on 10/13/15 at Dr. Myles Gip office, and he recommended increasing lisinopril to 40 mg daily and having patient follow-up with Dr. Hilma Favors to see if amlodipine needs further titration. BP on 09/30/15 at Dr. Luther Parody office was 153/96.) I spoke with him this  morning and BP at work (after taking amlodipine and lisinopril) was 160/110.  10/27/15 CTA abd/pelvis: IMPRESSION: - 5.1 x 5.2 cm fusiform infrarenal abdominal aortic aneurysm with mural thrombus. Slight interval enlargement. Measurements as above. - No other acute intra-abdominal or pelvic finding by arterial phase CT.  02/11/15 CT Chest/abd/pelvis: IMPRESSION: 1. No evidence of metastatic disease in the chest, abdomen or pelvis. 2. Multiple tiny pulmonary nodules have all been stable for over two years, suggesting benign nodules. 3. Slight interval growth of 5.0 cm fusiform infrarenal abdominal aortic aneurysm. Recommend followup by abdomen and pelvis CTA in 3-6 months, and vascular surgery referral/consultation if not already obtained. This recommendation follows ACR consensus guidelines: White Paper of the ACR Incidental Findings Committee II on Vascular Findings. J Am Coll Radiol 2013; 10:789-794. 4. Atherosclerosis, including two-vessel coronary artery disease. Please note that although the presence of coronary artery calcium documents the presence of coronary artery disease, the severity of this disease and any potential stenosis cannot be assessed on this non-gated CT examination. Assessment for potential risk factor modification, dietary therapy or pharmacologic therapy may be warranted, if clinically indicated.  Preoperative labs noted. Na 130. K 3.4. Ca 8.8. Glucose 135. CBC, PT/PTT WNL.   Due to BP of 160/110 today despite taking both amlodipine and lisinopril, I advised patient to contact his PCP to inquire if he had any further recommendations regarding HTN management. (I asked that patient let me know if any med changes.) He reports someone (presumably from [facility]) had instructed him to take ASA and lisinopril on the morning of surgery. Since he  takes amlodipine at night, he was encouraged to take this the night before surgery as well. Typically ACEI are held on the morning of  surgery, but with a BP of > 180/100 off lisinopril then I think it is probably best for him to take in hopes BP will be reasonable on the day of surgery. (Anesthesiologist Dr. Lissa Hoard also agreed with taking AM lisinopril.) I have left a message with [facility] nurses Colletta Maryland and Zigmund Daniel to report BP readings and to clarify that patient understood correctly about day of surgery medications. I will plan to follow-up with patient to verify if any medication changes and review morning of surgery medications once I hear back from [place] or [place].  George Hugh [facility]/Anesthesiology Phone [phone] 11/19/2015 5:11 PM  Addendum: I spoke with [facility] RN Zigmund Daniel. She said their office did instruct patient about ASA, but deferred anti-hypertensive instructions to PAT/anesthesia. I called and spoke with Mr. Criss Alvine. He was actually seen at his PCP office yesterday evening. Reportedly BP then was 138/92. She discontinued amlodipine and lisinopril and started him on Azor (amlodipine/olmesartan) 5/40 mg Q PM. He is starting it this afternoon. He has access to a BP monitor over the weekend, so will check his BP tomorrow morning. If BP is < 180/100 then I think he is likely okay to continue on the afternoon dosing. However, if BP is > 180/100 then he was advised to call the on-call provider at his PCP office to clarify timing of his medication. If he is given instructions to take in the AM then he knows to take with sips of water on the day of surgery.   George Hugh [facility] Stay [facility]/Anesthesiology Phone [phone] 11/20/2015 1:14 PM

## 2015-11-22 MED ORDER — VANCOMYCIN HCL IN DEXTROSE 1-5 GM/200ML-% IV SOLN
1000.0000 mg | INTRAVENOUS | Status: DC
  Filled 2015-11-22: qty 200

## 2015-11-22 NOTE — Anesthesia Preprocedure Evaluation (Deleted)
Anesthesia Evaluation  Patient identified by MRN, date of birth, ID band Patient awake    Reviewed: Allergy & Precautions, NPO status , Patient's Chart, lab work & pertinent test results  Airway Mallampati: II  TM Distance: >3 FB Neck ROM: Full    Dental  (+) Teeth Intact   Pulmonary neg pulmonary ROS, Current Smoker,    breath sounds clear to auscultation       Cardiovascular hypertension, Pt. on medications + Peripheral Vascular Disease   Rhythm:Regular Rate:Normal     Neuro/Psych  Headaches, PSYCHIATRIC DISORDERS Anxiety    GI/Hepatic Neg liver ROS, GERD  Medicated,  Endo/Other  negative endocrine ROS  Renal/GU negative Renal ROS  negative genitourinary   Musculoskeletal negative musculoskeletal ROS (+)   Abdominal   Peds negative pediatric ROS (+)  Hematology negative hematology ROS (+)   Anesthesia Other Findings   Reproductive/Obstetrics negative OB ROS                            Lab Results  Component Value Date   WBC 8.7 11/18/2015   HGB 16.9 11/18/2015   HCT 50.1 11/18/2015   MCV 94.4 11/18/2015   PLT 265 11/18/2015   Lab Results  Component Value Date   CREATININE 1.10 11/18/2015   BUN 12 11/18/2015   NA 130 (L) 11/18/2015   K 3.4 (L) 11/18/2015   CL 99 (L) 11/18/2015   CO2 22 11/18/2015   Lab Results  Component Value Date   INR 1.03 11/18/2015   10/2015 EKG: normal sinus rhythm.   Anesthesia Physical Anesthesia Plan  ASA: III  Anesthesia Plan: General   Post-op Pain Management:    Induction: Intravenous  Airway Management Planned: Oral ETT  Additional Equipment: Arterial line  Intra-op Plan:   Post-operative Plan: Extubation in OR  Informed Consent: I have reviewed the patients History and Physical, chart, labs and discussed the procedure including the risks, benefits and alternatives for the proposed anesthesia with the patient or authorized  representative who has indicated his/her understanding and acceptance.     Plan Discussed with: CRNA  Anesthesia Plan Comments: (BP is currently 230's/120's despite multiple doses of IV beta blockers. Will start Nitroprusside and discuss cancellation with Dr. Donnetta Hutching and ED admission for BP control before procedure.   Cancelled due to BP.)      Anesthesia Quick Evaluation

## 2015-11-23 ENCOUNTER — Encounter (HOSPITAL_COMMUNITY): Payer: Self-pay | Admitting: *Deleted

## 2015-11-23 ENCOUNTER — Ambulatory Visit (HOSPITAL_COMMUNITY)
Admission: RE | Admit: 2015-11-23 | Discharge: 2015-11-23 | Disposition: A | Discharge status: Home / Self Care | Payer: BLUE CROSS/BLUE SHIELD | Source: Ambulatory Visit | Attending: Vascular Surgery | Admitting: Vascular Surgery

## 2015-11-23 ENCOUNTER — Encounter (HOSPITAL_COMMUNITY)
Admission: RE | Disposition: A | Discharge status: Home / Self Care | Payer: Self-pay | Source: Ambulatory Visit | Attending: Vascular Surgery

## 2015-11-23 DIAGNOSIS — F17210 Nicotine dependence, cigarettes, uncomplicated: Secondary | ICD-10-CM | POA: Insufficient documentation

## 2015-11-23 DIAGNOSIS — Z5309 Procedure and treatment not carried out because of other contraindication: Secondary | ICD-10-CM | POA: Insufficient documentation

## 2015-11-23 DIAGNOSIS — Z8547 Personal history of malignant neoplasm of testis: Secondary | ICD-10-CM | POA: Insufficient documentation

## 2015-11-23 DIAGNOSIS — I10 Essential (primary) hypertension: Secondary | ICD-10-CM

## 2015-11-23 DIAGNOSIS — K219 Gastro-esophageal reflux disease without esophagitis: Secondary | ICD-10-CM | POA: Diagnosis not present

## 2015-11-23 DIAGNOSIS — Z79899 Other long term (current) drug therapy: Secondary | ICD-10-CM | POA: Diagnosis not present

## 2015-11-23 DIAGNOSIS — Z7982 Long term (current) use of aspirin: Secondary | ICD-10-CM | POA: Insufficient documentation

## 2015-11-23 DIAGNOSIS — E785 Hyperlipidemia, unspecified: Secondary | ICD-10-CM | POA: Diagnosis not present

## 2015-11-23 DIAGNOSIS — I714 Abdominal aortic aneurysm, without rupture: Secondary | ICD-10-CM

## 2015-11-23 DIAGNOSIS — I160 Hypertensive urgency: Secondary | ICD-10-CM | POA: Diagnosis not present

## 2015-11-23 SURGERY — INSERTION, ENDOVASCULAR STENT GRAFT, AORTA, ABDOMINAL
Anesthesia: General

## 2015-11-23 MED ORDER — CHLORHEXIDINE GLUCONATE CLOTH 2 % EX PADS: 6.0000 | MEDICATED_PAD | Freq: Once | CUTANEOUS | Status: DC

## 2015-11-23 MED ORDER — LIDOCAINE 2% (20 MG/ML) 5 ML SYRINGE
INTRAMUSCULAR | Status: AC
  Filled 2015-11-23: qty 5

## 2015-11-23 MED ORDER — NEOSTIGMINE METHYLSULFATE 5 MG/5ML IV SOSY
PREFILLED_SYRINGE | INTRAVENOUS | Status: AC
Start: 2015-11-23 — End: 2015-11-23
  Filled 2015-11-23: qty 5

## 2015-11-23 MED ORDER — LABETALOL HCL 5 MG/ML IV SOLN
5.0000 mg | Freq: Once | INTRAVENOUS | Status: AC
  Administered 2015-11-23: 5 mg via INTRAVENOUS
  Filled 2015-11-23: qty 4

## 2015-11-23 MED ORDER — ARTIFICIAL TEARS OP OINT
TOPICAL_OINTMENT | OPHTHALMIC | Status: AC
  Filled 2015-11-23: qty 3.5

## 2015-11-23 MED ORDER — LABETALOL HCL 5 MG/ML IV SOLN
INTRAVENOUS | Status: AC
  Filled 2015-11-23: qty 4

## 2015-11-23 MED ORDER — ONDANSETRON HCL 4 MG/2ML IJ SOLN
INTRAMUSCULAR | Status: AC
  Filled 2015-11-23: qty 2

## 2015-11-23 MED ORDER — LACTATED RINGERS IV SOLN
INTRAVENOUS | Status: DC
  Administered 2015-11-23: 06:00:00 via INTRAVENOUS

## 2015-11-23 MED ORDER — MIDAZOLAM HCL 2 MG/2ML IJ SOLN
INTRAMUSCULAR | Status: AC
  Filled 2015-11-23: qty 2

## 2015-11-23 MED ORDER — CARVEDILOL 6.25 MG PO TABS: 6.2500 mg | ORAL_TABLET | Freq: Two times a day (BID) | ORAL | Status: DC

## 2015-11-23 MED ORDER — NITROPRUSSIDE SODIUM 25 MG/ML IV SOLN
0.0000 ug/kg/min | INTRAVENOUS | Status: DC
  Filled 2015-11-23: qty 2

## 2015-11-23 MED ORDER — ROCURONIUM BROMIDE 50 MG/5ML IV SOLN
INTRAVENOUS | Status: AC
  Filled 2015-11-23: qty 1

## 2015-11-23 MED ORDER — PROPOFOL 10 MG/ML IV BOLUS
INTRAVENOUS | Status: AC
  Filled 2015-11-23: qty 20

## 2015-11-23 MED ORDER — METOPROLOL TARTRATE 5 MG/5ML IV SOLN
5.0000 mg | Freq: Once | INTRAVENOUS | Status: AC
  Administered 2015-11-23: 5 mg via INTRAVENOUS
  Filled 2015-11-23: qty 5

## 2015-11-23 MED ORDER — FENTANYL CITRATE (PF) 250 MCG/5ML IJ SOLN
INTRAMUSCULAR | Status: AC
  Filled 2015-11-23: qty 5

## 2015-11-23 MED ORDER — SODIUM CHLORIDE 0.9 % IV SOLN: INTRAVENOUS | Status: DC

## 2015-11-23 MED ORDER — GLYCOPYRROLATE 0.2 MG/ML IV SOSY
PREFILLED_SYRINGE | INTRAVENOUS | Status: AC
  Filled 2015-11-23: qty 3

## 2015-11-23 MED ORDER — METOPROLOL TARTRATE 5 MG/5ML IV SOLN
INTRAVENOUS | Status: AC
  Filled 2015-11-23: qty 5

## 2015-11-23 MED ORDER — CARVEDILOL 6.25 MG PO TABS: 6.2500 mg | ORAL_TABLET | Freq: Two times a day (BID) | ORAL | 3 refills | Status: DC

## 2015-11-23 NOTE — Progress Notes (Signed)
Consent given to send message for code for my chart

## 2015-11-23 NOTE — Progress Notes (Signed)
Patient ID: Shane Faulkner, male   DOB: [birth year], 55 y.o.   MRN: [id number] The patient presented this morning for elective stent graft repair. He does have a significant history of hypertension. Medications that have been adjusted slightly preoperatively. On presentation his blood pressure was 250/130 and remained this way despite IV medications per anesthesia. I discussed options with the patient his family and also anesthesia. Feel that he is at significantly increased risk with the severely poorly controlled hypertension. Feels safest to cancel his surgery for today due to this. Understands this is completely elective. Have discussed with [facility] who will see him prior to discharge this morning. Had seen Dr. Domenic Polite preoperatively who is noted blood pressure issues as well. Will reschedule once stabilized on new antihypertensive regimen

## 2015-11-23 NOTE — Consult Note (Signed)
CARDIOLOGY CONSULT NOTE   Patient ID: Shane Faulkner MRN: [id number], DOB/AGE: 1960-12-31   Admit date: 11/23/2015 Date of Consult: 11/23/2015   Primary Physician: Purvis Kilts, MD Primary Cardiologist: Dr. Domenic Polite  Pt. Profile  Shane Faulkner is a pleasant 55 yo male with PMH of HTN, HLD, GERD, h/o testicular CA s/p L orchiectomy and AAA presented for elective AAA repair after recent CTA of abdomen showed size of infrarenal AAA reached 5.2 cm. Procedure cancelled after he arrived with BP 250. Cardiology consulted for BP management.   Problem List  Past Medical History:  Diagnosis Date  . AAA (abdominal aortic aneurysm) ([place])    Followed by Dr. Donnetta Hutching  . Anxiety   . Essential hypertension   . GERD (gastroesophageal reflux disease)   . Headache(784.0)   . Hyperlipidemia   . Left testicular cancer [facility]) Feb 2014    Past Surgical History:  Procedure Laterality Date  . ORCHIECTOMY  06/05/2012   Procedure: ORCHIECTOMY;  Surgeon: Franchot Gallo, MD;  Location: [facility];  Service: Urology;  Laterality: Left;  Left Inguinal Orchiectomy  . TONSILLECTOMY  1968     Allergies  Allergies  Allergen Reactions  . Other Anaphylaxis    Cashews  . Penicillins Anaphylaxis    Has patient had a PCN reaction causing immediate rash, facial/tongue/throat swelling, SOB or lightheadedness with hypotension: Yes Has patient had a PCN reaction causing severe rash involving mucus membranes or skin necrosis: No Has patient had a PCN reaction that required hospitalization No Has patient had a PCN reaction occurring within the last 10 years: No If all of the above answers are "NO", then may proceed with Cephalosporin use.     HPI   Shane Faulkner is a pleasant 55 yo male with PMH of HTN, HLD, GERD, h/o testicular CA s/p L orchiectomy and AAA. Patient has a history of uncontrolled hypertension pressure. In 2015, his blood pressure during vascular office visit was 149/117. In November 2016, his  blood pressure during vascular office visit was 155/115. He says he has chronic blood pressure problem. However he does not have any symptom. Despite previous CT noting coronary artery calcifications, he has no anginal symptoms. He works in a [employer], contracting and does heavy lifting frequently without any exertional symptoms. He denies any headache, vision changes, dizziness associated with high blood pressure. He was previously on 5 mg amlodipine and 30 mg lisinopril. His recent CTA abdomen and pelvis obtained on 10/27/2015 showed fusiform infrarenal abdominal aortic aneurysm with mural thrombus 5.1 x 5.2 cm. After discussing with vascular surgery, he opted for elective stent repair of infrarenal AAA. He was seen by Dr. Domenic Polite for preoperative clearance back in June 2017, given the lack of exertional-type symptom, no further workup was planned prior to surgery. His blood pressure during the office visit was [id number] systolic. His lisinopril was increased to 40 mg daily. Patient came in on 11/18/2015 for preoperative assessment, his blood pressure was 182/110. Patient was switched to amlodipine/homicidal and 5 mg/40 mg by his PCP.  He took his blood pressure medication Azor (5mg  amlodipine/40mg  olmesartan) this morning before coming to the hospital for elective stent graft to repair of AAA by Dr Donnetta Hutching. On presentation, his blood pressure was 250/130s. He was given 5 mg IV Lopressor and 5 mg IV labetalol without significant improvement. Cardiology has been consulted for blood pressure management. Patient denies any discomfort at this time despite elevated blood pressure. He is essentially asymptomatic. He says he did take his  blood pressure medication this morning.   Of note, recent CTA a of abdomen and pelvis did not mention any significant renal artery stenosis. The origin of the left and right renal artery were widely patent on CTA. No occlusive process was noted.   Inpatient Medications  . Chlorhexidine  Gluconate Cloth  6 each Topical Once   And  . Chlorhexidine Gluconate Cloth  6 each Topical Once  . labetalol      . metoprolol      . vancomycin  1,000 mg Intravenous To SS-Surg    Family History Family History  Problem Relation Age of Onset  . AAA (abdominal aortic aneurysm) Father   . Hyperlipidemia Father      Social History Social History   Social History  . Marital status: Married    Spouse name: N/A  . Number of children: N/A  . Years of education: N/A   Occupational History  . Not on file.   Social History Main Topics  . Smoking status: Current Every Day Smoker    Packs/day: 1.50    Years: 32.00    Types: Cigarettes  . Smokeless tobacco: Never Used  . Alcohol use 14.4 oz/week    24 Cans of beer per week  . Drug use: No  . Sexual activity: Not on file   Other Topics Concern  . Not on file   Social History Narrative  . No narrative on file     Review of Systems  General:  No chills, fever, night sweats or weight changes.  Cardiovascular:  No chest pain, dyspnea on exertion, edema, orthopnea, palpitations, paroxysmal nocturnal dyspnea. Dermatological: No rash, lesions/masses Respiratory: No cough, dyspnea Urologic: No hematuria, dysuria Abdominal:   No nausea, vomiting, diarrhea, bright red blood per rectum, melena, or hematemesis Neurologic:  No visual changes, wkns, changes in mental status. All other systems reviewed and are otherwise negative except as noted above.  Physical Exam  Blood pressure (!) 220/136, pulse 93, temperature 98.6 F (37 C), temperature source Oral, resp. rate 20, weight 195 lb (88.5 kg), SpO2 100 %.  General: Pleasant, NAD Psych: Normal affect. Neuro: Alert and oriented X 3. Moves all extremities spontaneously. HEENT: Normal  Neck: Supple without bruits or JVD. Lungs:  Resp regular and unlabored, CTA. Heart: RRR no s3, s4, or murmurs. Abdomen: Soft, non-tender, non-distended, BS + x 4.  Extremities: No clubbing,  cyanosis or edema. DP/PT/Radials 2+ and equal bilaterally.  Labs  No results for input(s): CKTOTAL, CKMB, TROPONINI in the last 72 hours. Lab Results  Component Value Date   WBC 8.7 11/18/2015   HGB 16.9 11/18/2015   HCT 50.1 11/18/2015   MCV 94.4 11/18/2015   PLT 265 11/18/2015    Recent Labs Lab 11/18/15 1424  NA 130*  K 3.4*  CL 99*  CO2 22  BUN 12  CREATININE 1.10  CALCIUM 8.8*  PROT 6.8  BILITOT 0.8  ALKPHOS 77  ALT 20  AST 24  GLUCOSE 135*   No results found for: CHOL, HDL, LDLCALC, TRIG No results found for: DDIMER  Radiology/Studies  Ct Angio Abd/pel W/ And/or W/o  Result Date: 10/27/2015 CLINICAL DATA:  Abdominal aortic aneurysm EXAM: CT ANGIOGRAPHY ABDOMEN AND PELVIS TECHNIQUE: Multidetector CT imaging of the abdomen and pelvis was performed using the standard protocol during bolus administration of intravenous contrast. Multiplanar reconstructed images including MIPs were obtained and reviewed to evaluate the vascular anatomy. CONTRAST:  100 cc Isovue 370 COMPARISON:  02/11/2015 FINDINGS: Arterial findings: Aorta:  Atherosclerotic fusiform infrarenal abdominal aortic aneurysm noted. Circumferential mural thrombus evident. Central lumen remains patent. Maximal AP dimension 5.1 cm, transverse dimension 5.2 cm. Slight interval enlargement, previously measuring 5 x 5 cm. Aneurysm terminates at the bifurcation. Celiac axis: Widely patent. Splenic, left gastric and hepatic branches are patent. Superior mesenteric:  Widely patent. Left renal: Widely patent origin. No occlusive process. No accessory renal vasculature. Right renal: Widely patent origin. No occlusive process. No accessory renal artery. Inferior mesenteric: Origin is occluded off of the aneurysm anteriorly. IMA is reconstituted via SMA collaterals. Left iliac: Diffuse atherosclerotic changes. Left common, internal, and external iliac arteries are tortuous but patent. No iliac occlusion, aneurysm or dissection.  Visualize left common femoral, proximal profunda femoral, and proximal superficial femoral arteries are also patent. Right iliac: Similar diffuse atherosclerotic changes. Right common, internal, and external iliac arteries are mildly tortuous but remain patent. No occlusive process. Visualize right common femoral, profunda femoral, and superficial femoral arteries proximally are all patent. Venous findings:      Venous phase was not performed. Review of the MIP images confirms the above findings. Nonvascular findings: Lower chest: Clear lung bases. Normal heart size. No pericardial or pleural effusion. Abdomen pelvis: Liver demonstrates no biliary dilatation or definite focal hepatic abnormality. Gallbladder and biliary system unremarkable. Small transient hepatic attenuation defect in the left hepatic lobe anteriorly, image 40 measuring 5 mm. No other significant hepatic abnormality. Pancreas, spleen, and adrenal glands within normal limits for age and demonstrate no acute process. Kidneys demonstrate diffuse scattered cortical atrophy without obstruction or hydronephrosis. No acute process. Negative for bowel obstruction, significant dilatation, ileus, or free air. Normal appendix demonstrated. No abdominal free fluid or fluid collection. No pelvic free fluid, fluid collection, hemorrhage or abscess. No inguinal abnormality or significant hernia. Urinary bladder unremarkable. Intact abdominal wall. No ventral hernia. No acute osseous finding demonstrated. IMPRESSION: 5.1 x 5.2 cm fusiform infrarenal abdominal aortic aneurysm with mural thrombus. Slight interval enlargement. Measurements as above. No other acute intra-abdominal or pelvic finding by arterial phase CT. Recommend followup by abdomen and pelvis CTA in 3-6 months, and vascular surgery referral/consultation if not already obtained. This recommendation follows ACR consensus guidelines: White Paper of the ACR Incidental Findings Committee II on Vascular  Findings. J Am Coll Radiol 2013; 10:789-794. Electronically Signed   By: Jerilynn Mages.  Shick M.D.   On: 10/27/2015 16:55    ECG  Normal sinus rhythm without significant ST-T wave changes  ASSESSMENT AND PLAN  1. Hypertensive urgency  - Unclear cause for his elevated blood pressure. It appears he has always had uncontrolled high blood pressure dating back to at least 2015, however in 123456 his systolic blood pressure was in the 140s, recently however his blood pressure has been in the 180s to 200 range. Given the significant dilated infrarenal AAA, will need to control his blood pressure hopefully below systolic blood pressure 123456.  - Consider secondary HTN workup, patient does snore, however never had a sleep study in the past, consider obstructive sleep apnea as potential diagnosis with outpatient sleep study. I have reviewed the recent CT angiogram of abdomen and pelvis, no significant renal artery stenosis was seen. Also consider 24-hour urine cortisol.  - Will discuss with M.D. regarding blood pressure medication, consider adding 6.25 mg BID or 12.5 mg BID of carvedilol. Can also consider increase Azor to 10/40mg  (max dose) instead of 5mg /40mg   - likely ok for discharge once SBP < 180  2. HLD  3. AAA pending stent repair  by Dr. Donnetta Hutching  - recent CTA abdomen and pelvis obtained on 10/27/2015 showed fusiform infrarenal abdominal aortic aneurysm with mural thrombus 5.1 x 5.2 cm   Signed, Almyra Deforest, PA-C 11/23/2015, 9:44 AM   I have seen and examined the patient along with Almyra Deforest, PA-C.  I have reviewed the chart, notes and new data.  I agree with PA's note.  Key new complaints: asymptomatic Key examination changes: no signs of acute HF, no abdominal tenderness Key new findings / data: normal renal parameters, borderline low K  PLAN: Add carvedilol 6.25 mg BID. Check BP at home and send results to Dr. Domenic Polite (activated mychart today), may need additional titration. Consider evaluation for high  renin/high aldosterone state in the future in view of low K.  Sanda Klein, MD, [place] [phone] 11/23/2015, 10:55 AM

## 2015-11-23 NOTE — Progress Notes (Signed)
Spoke with Dr.Early who gave order to d/c home. IV d/c with tip to cath intact. Pt tolerated well.

## 2015-11-23 NOTE — Progress Notes (Signed)
Spoke with Dr.Moser at 0615,notifying him of B/P 212/120 and 220/136. Orders to get an IV started and he will come over and see pt.   @0625  Dr.Moser in talking with pt.Orders received to give Metoprolol 5mg  IV and Labetalol 5mg  IV now.

## 2015-11-24 ENCOUNTER — Encounter: Payer: Self-pay | Admitting: Physician Assistant

## 2015-11-24 DIAGNOSIS — I714 Abdominal aortic aneurysm, without rupture, unspecified: Secondary | ICD-10-CM | POA: Insufficient documentation

## 2015-11-24 DIAGNOSIS — I10 Essential (primary) hypertension: Secondary | ICD-10-CM | POA: Insufficient documentation

## 2015-11-24 DIAGNOSIS — E785 Hyperlipidemia, unspecified: Secondary | ICD-10-CM | POA: Insufficient documentation

## 2015-11-24 NOTE — Progress Notes (Deleted)
Cardiology Office Note    Date:  11/24/2015  ID:  Shane Faulkner, DOB 03/22/1961, MRN [id number] PCP:  Colette Ribas, MD  Cardiologist: Dr. Diona Browner  ***refresh  - This is a preliminary note being prepped for an upcoming appointment.   Chief Complaint: f/u high BP  History of Present Illness:  Shane Faulkner is a 55 y.o. male with history of HTN, HLD, GERD, h/o testicular CA s/p L orchiectomy and AAA who presents for follow-up of high blood pressure.   He was seen by Dr. Diona Browner for preoperative clearance back in June 2017 for AAA repair. Given the lack of exertional-type symptom, no further workup was planned prior to surgery. Lisinopril was increased to 40mg  daily for SBP 170s. At his pre-op assessment it was 182/110 thus he was switched to amlodipine/olmesartan. He was recently admitted for elective AAA repair after recent CTA of abdomen showed size of infrarenal AAA reached 5.2 cm. However, the procedure was cancelled after he was found to have a BP of 250 systolic. Cardiology was consulted for BP management. Of note, recent CTA a of abdomen and pelvis did not mention any significant renal artery stenosis. The origin of the left and right renal artery were widely patent on CTA. No occlusive process was noted. Coreg was added with recommendation to consider workup for secondary hypertension. Labs include UA neg for protein, Na 130, K 3.4, BUN/Cr 12/1.10, LFTs normal, albumin 3.8, CBC wnl.   Hypokalemia workup2nd htn - [facility]  Past Medical History:  Diagnosis Date  . AAA (abdominal aortic aneurysm) (HCC)    Followed by Dr. Arbie Cookey  . Anxiety   . Essential hypertension   . GERD (gastroesophageal reflux disease)   . Headache(784.0)   . Hyperlipidemia   . Left testicular cancer (HCC) 06/2012   a. s/p L orchiectomy    Past Surgical History:  Procedure Laterality Date  . ORCHIECTOMY  06/05/2012   Procedure: ORCHIECTOMY;  Surgeon: Marcine Matar, MD;  Location: [facility];   Service: Urology;  Laterality: Left;  Left Inguinal Orchiectomy  . TONSILLECTOMY  1968    Current Medications: Current Outpatient Prescriptions  Medication Sig Dispense Refill  . amLODipine (NORVASC) 5 MG tablet Take 5 mg by mouth daily.    Marland Kitchen amLODipine-olmesartan (AZOR) 5-40 MG tablet Take 1 tablet by mouth daily.    Marland Kitchen aspirin 81 MG EC tablet Take 81 mg by mouth every morning. Swallow whole.    . carvedilol (COREG) 6.25 MG tablet Take 1 tablet (6.25 mg total) by mouth 2 (two) times daily with a meal. 60 tablet 3  . omeprazole (PRILOSEC OTC) 20 MG tablet Take 20 mg by mouth daily as needed (for acid reflux or heartburn).     . pantoprazole (PROTONIX) 40 MG tablet Take 40 mg by mouth daily.    . sertraline (ZOLOFT) 25 MG tablet Take 25 mg by mouth at bedtime as needed (for mood).     No current facility-administered medications for this visit.      Allergies:   Other and Penicillins   Social History   Social History  . Marital status: Married    Spouse name: N/A  . Number of children: N/A  . Years of education: N/A   Social History Main Topics  . Smoking status: Current Every Day Smoker    Packs/day: 1.50    Years: 32.00    Types: Cigarettes  . Smokeless tobacco: Never Used  . Alcohol use 14.4 oz/week    24  Cans of beer per week  . Drug use: No  . Sexual activity: Not on file   Other Topics Concern  . Not on file   Social History Narrative  . No narrative on file     Family History:  The patient's family history includes AAA (abdominal aortic aneurysm) in his father; Hyperlipidemia in his father. ***  ROS:   Please see the history of present illness. Otherwise, review of systems is positive for ***.  All other systems are reviewed and otherwise negative.    PHYSICAL EXAM:   VS:  There were no vitals taken for this visit.  BMI: There is no height or weight on file to calculate BMI. GEN: Well nourished, well developed, in no acute distress  HEENT: normocephalic,  atraumatic Neck: no JVD, carotid bruits, or masses Cardiac: ***RRR; no murmurs, rubs, or gallops, no edema  Respiratory:  clear to auscultation bilaterally, normal work of breathing GI: soft, nontender, nondistended, + BS MS: no deformity or atrophy  Skin: warm and dry, no rash Neuro:  Alert and Oriented x 3, Strength and sensation are intact, follows commands Psych: euthymic mood, full affect  Wt Readings from Last 3 Encounters:  11/23/15 195 lb (88.5 kg)  11/18/15 195 lb 12.8 oz (88.8 kg)  10/13/15 191 lb (86.6 kg)      Studies/Labs Reviewed:   EKG:  EKG was ordered today and personally reviewed by me and demonstrates *** EKG was not ordered today.***  Recent Labs: 11/18/2015: ALT 20; BUN 12; Creatinine, Ser 1.10; Hemoglobin 16.9; Platelets 265; Potassium 3.4; Sodium 130   Lipid Panel No results found for: CHOL, TRIG, HDL, CHOLHDL, VLDL, LDLCALC, LDLDIRECT  Additional studies/ records that were reviewed today include: Summarized above.***    ASSESSMENT & PLAN:   1. Essential HTN 2. Hypokalemia 3. AAA, awaiting repair  Disposition: F/u with ***   Medication Adjustments/Labs and Tests Ordered: Current medicines are reviewed at length with the patient today.  Concerns regarding medicines are outlined above. Medication changes, Labs and Tests ordered today are summarized above and listed in the Patient Instructions accessible in Encounters.   Signed, Melina Copa PA-C  @TD @ @NOW @    [facility] in [place]. [place], [place] [address] Ph: [phone]; Fax [phone]

## 2015-11-26 ENCOUNTER — Encounter: Payer: BLUE CROSS/BLUE SHIELD | Admitting: Physician Assistant

## 2015-11-26 ENCOUNTER — Encounter: Payer: Self-pay | Admitting: Physician Assistant

## 2015-12-02 ENCOUNTER — Encounter: Payer: BLUE CROSS/BLUE SHIELD | Admitting: Cardiology

## 2015-12-07 NOTE — Progress Notes (Signed)
Cardiology Office Note    Date:  12/08/2015  ID:  SIMRAN VIERLING, DOB 1961-03-02, MRN [id number] PCP:  Purvis Kilts, MD  Cardiologist:  Rozann Lesches, MD   Chief Complaint: f/u BP  History of Present Illness:  Shane Faulkner is a 55 y.o. male with history of HTN, HLD, GERD, h/o testicular CA s/p L orchiectomy and AAA who presents for follow-up of high blood pressure.   He was seen by Dr. Domenic Polite for preoperative clearance back in June 2017 for AAA repair. Given the lack of exertional-type symptom, no further workup was planned prior to surgery. Lisinopril was increased to 40mg  daily for SBP 170s. At his pre-op assessment it was 182/110 thus he was switched to amlodipine/olmesartan. He was recently admitted for elective AAA repair after recent CTA of abdomen showed size of infrarenal AAA reached 5.2 cm. However, the procedure was cancelled after he was found to have a BP of [id number] systolic. Cardiology was consulted for BP management. Of note, recent CTA a of abdomen and pelvis did not mention any significant renal artery stenosis. The origin of the left and right renal artery were widely patent on CTA. No occlusive process was noted. Coreg was added with recommendation to consider workup for secondary hypertension. Labs include UA neg for protein, Na 130, K 3.4, BUN/Cr 12/1.10, LFTs normal, albumin 3.8, CBC wnl. He no-showed to his OV 11/26/15 with me. He cancelled his office visit 12/02/15 with Kerin Ransom.   He returns for follow-up. He has followed his BPs at home and in general they have continued to run [id number] systolic in the AM and [id number] in the PM. He has had occasional outlier in the 120s but this is rare. He does report generalized sense of anxiety/nervousness. He declines to get bloodwork today because he feels that will run his BP up even further. Denies any symptoms whatsoever related to BP. No CP, SOB, headache or stroke sx.   Past Medical History:  Diagnosis Date  . AAA  (abdominal aortic aneurysm) ([facility])    Followed by Dr. Donnetta Hutching  . Anxiety   . Essential hypertension   . GERD (gastroesophageal reflux disease)   . Headache(784.0)   . Hyperlipidemia   . Left testicular cancer ([facility]) 06/2012   a. s/p L orchiectomy    Past Surgical History:  Procedure Laterality Date  . ORCHIECTOMY  06/05/2012   Procedure: ORCHIECTOMY;  Surgeon: Franchot Gallo, MD;  Location: [facility];  Service: Urology;  Laterality: Left;  Left Inguinal Orchiectomy  . TONSILLECTOMY  1968    Current Medications: Outpatient Medications Prior to Visit  Medication Sig Dispense Refill  . amLODipine-olmesartan (AZOR) 5-40 MG tablet Take 1 tablet by mouth daily.    Marland Kitchen aspirin 81 MG EC tablet Take 81 mg by mouth every morning. Swallow whole.    . carvedilol (COREG) 6.25 MG tablet Take 1 tablet (6.25 mg total) by mouth 2 (two) times daily with a meal. 60 tablet 3  . pantoprazole (PROTONIX) 40 MG tablet Take 40 mg by mouth daily.    . sertraline (ZOLOFT) 25 MG tablet Take 25 mg by mouth at bedtime as needed (for mood).    Marland Kitchen omeprazole (PRILOSEC OTC) 20 MG tablet Take 20 mg by mouth daily as needed (for acid reflux or heartburn).      No facility-administered medications prior to visit.      Allergies:   Other and Penicillins   Social History   Social History  . Marital status:  Married    Spouse name: N/A  . Number of children: N/A  . Years of education: N/A   Social History Main Topics  . Smoking status: Current Every Day Smoker    Packs/day: 1.50    Years: 32.00    Types: Cigarettes    Start date: 12/08/1978  . Smokeless tobacco: Never Used  . Alcohol use 14.4 oz/week    24 Cans of beer per week  . Drug use: No  . Sexual activity: Not Asked   Other Topics Concern  . None   Social History Narrative  . None     Family History:  The patient's family history includes AAA (abdominal aortic aneurysm) in his father; Hyperlipidemia in his father.   ROS:   Please see the history  of present illness.  All other systems are reviewed and otherwise negative.    PHYSICAL EXAM:   VS:  BP (!) 194/118   Pulse 64   Ht 6\' 1"  (1.854 m)   Wt 190 lb (86.2 kg)   SpO2 99%   BMI 25.07 kg/m   BMI: Body mass index is 25.07 kg/m. Recheck by me 190/110 GEN: Well nourished, well developed WM, in no acute distress  HEENT: normocephalic, atraumatic Neck: no JVD, carotid bruits, or masses Cardiac: RRR; no murmurs or rubs. + S4. no edema  Respiratory:  clear to auscultation bilaterally, normal work of breathing GI: soft, nontender, nondistended, + BS MS: no deformity or atrophy  Skin: warm and dry, no rash Neuro:  Alert and Oriented x 3, Strength and sensation are intact, follows commands Psych: euthymic mood, full affect  Wt Readings from Last 3 Encounters:  12/08/15 190 lb (86.2 kg)  11/23/15 195 lb (88.5 kg)  11/18/15 195 lb 12.8 oz (88.8 kg)      Studies/Labs Reviewed:   EKG:  EKG was ordered today and personally reviewed by me and demonstrates NSR 83bpm, no acute changes  Recent Labs: 11/18/2015: ALT 20; BUN 12; Creatinine, Ser 1.10; Hemoglobin 16.9; Platelets 265; Potassium 3.4; Sodium 130   Lipid Panel No results found for: CHOL, TRIG, HDL, CHOLHDL, VLDL, LDLCALC, LDLDIRECT  Additional studies/ records that were reviewed today include: Summarized above    ASSESSMENT & PLAN:   1. Essential HTN - remains elevated despite Azor 5/40 and Coreg 6.25mg  BID. I suspect he will ultimately require a few more med adjustments. Will increase Azor to 10/40 (new rx) and carvedilol to 2 tablets BID (continue same rx, just double up). Will have him return on Friday for nurse BP check to determine need for further med adjustment. Would probable need to add a diuretic next (chlorthalidone or spironolactone) but it would be helpful to have workup for secondary HTN complete before addition of these meds so the medication does not skew results. Additionally, with hypokalemia he would  require repletion if he went on chlorthalidone. Will proceed with renin/aldosterone ratio to exclude hyperaldosteronism, 24-hour urinary cortisol to exclude Cushings, and workup for pheochromocytoma with 24-hour urine metanephrines/catecholamines. He has already had normal TSH, albumin, and normal renal arteries by CTA. Note: AVS nurse instructions listed for patient to take Coreg 2 tablets daily. The rx was updated appropriately but the nurse will be calling him to make sure he gets the correct instructions to take 2 tablets TWICE a day. 2. Hypokalemia - wanted to recheck BMET today but patient says he will return another day. 3. AAA, awaiting repair - on hold while BP is being controlled.  Disposition: F/u with  nurse BP visit on Friday to help guide further plans for follow-up. Tentatively schedule APP visit in 10 days.   Medication Adjustments/Labs and Tests Ordered: Current medicines are reviewed at length with the patient today.  Concerns regarding medicines are outlined above. Medication changes, Labs and Tests ordered today are summarized above and listed in the Patient Instructions accessible in Encounters.   Signed, Melina Copa PA-C  12/08/2015 2:21 PM    [facility] in [place], [place] [address] Ph: [phone]; Fax [phone]

## 2015-12-08 ENCOUNTER — Ambulatory Visit (INDEPENDENT_AMBULATORY_CARE_PROVIDER_SITE_OTHER): Payer: BLUE CROSS/BLUE SHIELD | Admitting: Physician Assistant

## 2015-12-08 ENCOUNTER — Encounter (INDEPENDENT_AMBULATORY_CARE_PROVIDER_SITE_OTHER): Payer: Self-pay

## 2015-12-08 ENCOUNTER — Encounter: Payer: Self-pay | Admitting: Physician Assistant

## 2015-12-08 VITALS — BP 194/118 | HR 64 | Ht 73.0 in | Wt 190.0 lb

## 2015-12-08 DIAGNOSIS — I714 Abdominal aortic aneurysm, without rupture, unspecified: Secondary | ICD-10-CM

## 2015-12-08 DIAGNOSIS — E876 Hypokalemia: Secondary | ICD-10-CM

## 2015-12-08 DIAGNOSIS — I10 Essential (primary) hypertension: Secondary | ICD-10-CM

## 2015-12-08 MED ORDER — CARVEDILOL 6.25 MG PO TABS: 12.5000 mg | ORAL_TABLET | Freq: Two times a day (BID) | ORAL | 3 refills | Status: DC

## 2015-12-08 MED ORDER — AMLODIPINE-OLMESARTAN 10-40 MG PO TABS: 1.0000 | ORAL_TABLET | Freq: Every day | ORAL | 11 refills | Status: DC

## 2015-12-08 NOTE — Patient Instructions (Addendum)
Your physician recommends that you schedule a follow-up appointment Friday for BP Check  Your physician recommends that you schedule a follow-up appointment in: 10 Days Your physician recommends that you return for lab work in: Today    Your physician has recommended you make the following change in your medication:   Increase Azor to 10mg /40mg  Daily Increase Coreg to 2 Tablets Twice Daily   If you need a refill on your cardiac medications before your next appointment, please call your pharmacy.  Thank you for choosing [facility]!

## 2015-12-11 ENCOUNTER — Other Ambulatory Visit: Payer: Self-pay

## 2015-12-11 ENCOUNTER — Ambulatory Visit (INDEPENDENT_AMBULATORY_CARE_PROVIDER_SITE_OTHER): Payer: BLUE CROSS/BLUE SHIELD | Admitting: *Deleted

## 2015-12-11 VITALS — BP 158/88

## 2015-12-11 DIAGNOSIS — I10 Essential (primary) hypertension: Secondary | ICD-10-CM | POA: Diagnosis not present

## 2015-12-11 NOTE — Progress Notes (Signed)
BP remains elevated. I have reviewed Dr. Myles Gip notes. He is in the process of working up secondary causes of hypertension. Will defer further management to him.

## 2015-12-11 NOTE — Progress Notes (Signed)
Patient denies any complaints at this time. Will route to DOD, Primary Cardiology MD and Melina Copa, PA-C.

## 2015-12-11 NOTE — Patient Instructions (Signed)
Your physician recommends that you schedule a follow-up appointment as scheduled.    If you need a refill on your cardiac medications before your next appointment, please call your pharmacy.  Thank you for choosing [facility]!

## 2015-12-11 NOTE — Telephone Encounter (Signed)
Error, another nurse already documented on pt

## 2015-12-14 ENCOUNTER — Other Ambulatory Visit: Payer: Self-pay

## 2015-12-15 ENCOUNTER — Telehealth: Payer: Self-pay | Admitting: *Deleted

## 2015-12-15 NOTE — Telephone Encounter (Signed)
Called pt to remind him of the importance of having lab work done. No answer. Left message to call office next day.

## 2015-12-15 NOTE — Telephone Encounter (Signed)
-----   Message from Charlie Pitter, [place] sent at 12/15/2015  4:52 PM EDT -----   ----- Message ----- From: Levonne Hubert, LPN Sent: [id number]   4:33 PM To: Charlie Pitter, PA-C

## 2015-12-15 NOTE — Progress Notes (Signed)
BP improving but still not optimal. Please make patient aware we need to get his labs to help [place] decide next step. Should be done ASAP as they were ordered last week. Tramayne Sebesta PA-C

## 2015-12-21 ENCOUNTER — Ambulatory Visit (INDEPENDENT_AMBULATORY_CARE_PROVIDER_SITE_OTHER): Payer: BLUE CROSS/BLUE SHIELD | Admitting: Adult Health

## 2015-12-21 ENCOUNTER — Encounter: Payer: Self-pay | Admitting: Adult Health

## 2015-12-21 VITALS — BP 170/100 | HR 84 | Ht 73.0 in | Wt 192.0 lb

## 2015-12-21 DIAGNOSIS — I714 Abdominal aortic aneurysm, without rupture, unspecified: Secondary | ICD-10-CM

## 2015-12-21 DIAGNOSIS — I10 Essential (primary) hypertension: Secondary | ICD-10-CM | POA: Diagnosis not present

## 2015-12-21 MED ORDER — HYDRALAZINE HCL 25 MG PO TABS: 25.0000 mg | ORAL_TABLET | Freq: Two times a day (BID) | ORAL | 3 refills | Status: DC

## 2015-12-21 MED ORDER — HYDRALAZINE HCL 25 MG PO TABS: 12.5000 mg | ORAL_TABLET | Freq: Two times a day (BID) | ORAL | 3 refills | Status: DC

## 2015-12-21 NOTE — Progress Notes (Signed)
Cardiology Office Note   Date:  12/21/2015   ID:  Shane Faulkner, DOB [birth year], MRN [id number]  PCP:  Purvis Kilts, MD  Cardiologist: [place]/  Jory Sims, NP   Chief Complaint  Patient presents with  . Hypertension  . AAA      History of Present Illness: Shane Faulkner is a 55 y.o. male who presents for ongoing assessment and management of pulmonary hypertension, hypertension, history of AAA, with planned repair in July of 2017, with other history to include anxiety, GERD, and history of testicular cancer.  Recent CTA a of abdomen and pelvis did not mention any significant renal artery stenosis. The origin of the left and right renal artery were widely patent on CTA. No occlusive process was noted. Coreg was added with recommendation to consider workup for secondary hypertension  During hospitalization for AAA repair the patient was found to have hypertensive urgency.Due AAA was extremely important to make sure her blood pressure was well controlled. The patient was started on carvedilol 6.25 mg twice a day.the patient had a followup appointment with the Melina Copa, physician assistant,on 12/08/2015. Blood pressures were well controlled.AAA repair was postponed until blood pressure was normalized.  Workup for secondary causes of hypertension to include renin/aldosterone ratio, 24-hour urine for cortisol to exclude Cushing's and workup for pheochromocytoma, with 24-hour urine for metanephrine's and catecholamines.  CT Abdomen/Pelvis IMPRESSION: 5.1 x 5.2 cm fusiform infrarenal abdominal aortic aneurysm with mural thrombus. Slight interval enlargement. Measurements as above.  No other acute intra-abdominal or pelvic finding by arterial phase CT.  He did not have labs done as he did not understand what was expected. He continues to have elevated BP despite being medically complaint. He is on 3 antihypertensive medications.  Past Medical History:  Diagnosis Date  .  AAA (abdominal aortic aneurysm) ([facility])    Followed by Dr. Donnetta Hutching  . Anxiety   . Essential hypertension   . GERD (gastroesophageal reflux disease)   . Headache(784.0)   . Hyperlipidemia   . Left testicular cancer ([facility]) 06/2012   a. s/p L orchiectomy    Past Surgical History:  Procedure Laterality Date  . ORCHIECTOMY  06/05/2012   Procedure: ORCHIECTOMY;  Surgeon: Franchot Gallo, MD;  Location: [facility];  Service: Urology;  Laterality: Left;  Left Inguinal Orchiectomy  . TONSILLECTOMY  1968     Current Outpatient Prescriptions  Medication Sig Dispense Refill  . amLODipine-olmesartan (AZOR) 10-40 MG tablet Take 1 tablet by mouth daily. 30 tablet 11  . aspirin 81 MG EC tablet Take 81 mg by mouth every morning. Swallow whole.    . carvedilol (COREG) 6.25 MG tablet Take 2 tablets (12.5 mg total) by mouth 2 (two) times daily with a meal. 60 tablet 3  . pantoprazole (PROTONIX) 40 MG tablet Take 40 mg by mouth daily.    . sertraline (ZOLOFT) 25 MG tablet Take 25 mg by mouth at bedtime as needed (for mood).    . hydrALAZINE (APRESOLINE) 25 MG tablet Take 1 tablet (25 mg total) by mouth 2 (two) times daily. 180 tablet 3   No current facility-administered medications for this visit.     Allergies:   Other and Penicillins    Social History:  The patient  reports that he has been smoking Cigarettes.  He started smoking about 37 years ago. He has a 48.00 pack-year smoking history. He has never used smokeless tobacco. He reports that he drinks about 14.4 oz of alcohol per week . He  reports that he does not use drugs.   Family History:  The patient's family history includes AAA (abdominal aortic aneurysm) in his father; Hyperlipidemia in his father.    ROS: All other systems are reviewed and negative. Unless otherwise mentioned in H&P    PHYSICAL EXAM: VS:  BP (!) 170/100   Pulse 84   Ht 6\' 1"  (1.854 m)   Wt 192 lb (87.1 kg)   SpO2 97%   BMI 25.33 kg/m  , BMI Body mass index is 25.33  kg/m. GEN: Well nourished, well developed, in no acute distress  HEENT: normal  Neck: no JVD, carotid bruits, or masses Cardiac: RRR; S4 murmur no murmurs, rubs, or gallops,no edema  Respiratory:  clear to auscultation bilaterally, normal work of breathing GI: soft, nontender, nondistended, + BS MS: no deformity or atrophy  Skin: warm and dry, no rash Neuro:  Strength and sensation are intact Psych: euthymic mood, full affect   Recent Labs: 11/18/2015: ALT 20; BUN 12; Creatinine, Ser 1.10; Hemoglobin 16.9; Platelets 265; Potassium 3.4; Sodium 130    Lipid Panel No results found for: CHOL, TRIG, HDL, CHOLHDL, VLDL, LDLCALC, LDLDIRECT    Wt Readings from Last 3 Encounters:  12/21/15 192 lb (87.1 kg)  12/08/15 190 lb (86.2 kg)  11/23/15 195 lb (88.5 kg)     ASSESSMENT AND PLAN:  1. Malignant hypertension: BP is still not well controlled. He wishes to avoid abs and urine tests at this time. Will plan sleep study for him. I will add hydralazine 25 mg BID. He will see [place] again in one week for BP check and one month for follow up. He is not to have surgery until BP is controlled. May need to titrate up hydralazine on BP check.   2. AAA: Planned surgical repair is postponed until BP is controlled. Close follow up.   3. Pulmonary Hypertension: Will evaluate with sleep study.    Current medicines are reviewed at length with the patient today.    Labs/ tests ordered today include: Sleep study  Orders Placed This Encounter  Procedures  . Nocturnal polysomnography (NPSG)     Disposition:   FU with Signed, Jory Sims, NP  12/21/2015 3:24 PM    [facility] [address], [place], [place] [address] Phone: [phone]; Fax: [phone]

## 2015-12-21 NOTE — Patient Instructions (Signed)
Your physician recommends that you schedule a follow-up appointment in: 1 Month with Jory Sims, NP.  Your physician has recommended you make the following change in your medication: Start Hydralazine 25 mg 1 Tablet Two Times Daily  Your physician has recommended that you have a sleep study. This test records several body functions during sleep, including: brain activity, eye movement, oxygen and carbon dioxide blood levels, heart rate and rhythm, breathing rate and rhythm, the flow of air through your mouth and nose, snoring, body muscle movements, and chest and belly movement.  If you need a refill on your cardiac medications before your next appointment, please call your pharmacy.  Thank you for choosing [facility]!

## 2015-12-21 NOTE — Progress Notes (Signed)
Name: Shane Faulkner    DOB: Aug 18, 1960  Age: 55 y.o.  MR#: [id number]       PCP:  Purvis Kilts, MD      Insurance: Payor: BLUE [place] / Plan: BCBS OTHER / Product Type: *No Product type* /   CC:   No chief complaint on file.   VS Vitals:   12/21/15 1410  BP: (!) 170/100  Pulse: 84  SpO2: 97%  Weight: 192 lb (87.1 kg)  Height: 6\' 1"  (1.854 m)    Weights Current Weight  12/21/15 192 lb (87.1 kg)  12/08/15 190 lb (86.2 kg)  11/23/15 195 lb (88.5 kg)    Blood Pressure  BP Readings from Last 3 Encounters:  12/21/15 (!) 170/100  12/11/15 (!) 158/88  12/08/15 (!) 194/118     Admit date:  (Not on file) Last encounter with RMR:  Visit date not found   Allergy Other and Penicillins  Current Outpatient Prescriptions  Medication Sig Dispense Refill  . amLODipine-olmesartan (AZOR) 10-40 MG tablet Take 1 tablet by mouth daily. 30 tablet 11  . aspirin 81 MG EC tablet Take 81 mg by mouth every morning. Swallow whole.    . carvedilol (COREG) 6.25 MG tablet Take 2 tablets (12.5 mg total) by mouth 2 (two) times daily with a meal. 60 tablet 3  . pantoprazole (PROTONIX) 40 MG tablet Take 40 mg by mouth daily.    . sertraline (ZOLOFT) 25 MG tablet Take 25 mg by mouth at bedtime as needed (for mood).     No current facility-administered medications for this visit.     Discontinued Meds:   There are no discontinued medications.  Patient Active Problem List   Diagnosis Date Noted  . Hyperlipidemia   . Essential hypertension   . AAA (abdominal aortic aneurysm) ([place])   . AAA (abdominal aortic aneurysm) without rupture ([place]) 07/10/2012  . Hemorrhoids 11/01/2011  . Rectal bleeding 11/01/2011    LABS    Component Value Date/Time   NA 130 (L) 11/18/2015 1424   NA 137 06/04/2012 1410   K 3.4 (L) 11/18/2015 1424   K 4.7 06/04/2012 1410   CL 99 (L) 11/18/2015 1424   CL 97 06/04/2012 1410   CO2 22 11/18/2015 1424   CO2 29 06/04/2012 1410   GLUCOSE 135 (H)  11/18/2015 1424   GLUCOSE 114 (H) 06/04/2012 1410   BUN 12 11/18/2015 1424   BUN 14 06/04/2012 1410   CREATININE 1.10 11/18/2015 1424   CREATININE 1.28 06/04/2012 1410   CALCIUM 8.8 (L) 11/18/2015 1424   CALCIUM 10.2 06/04/2012 1410   GFRNONAA >60 11/18/2015 1424   GFRNONAA 63 (L) 06/04/2012 1410   GFRAA >60 11/18/2015 1424   GFRAA 73 (L) 06/04/2012 1410   CMP     Component Value Date/Time   NA 130 (L) 11/18/2015 1424   K 3.4 (L) 11/18/2015 1424   CL 99 (L) 11/18/2015 1424   CO2 22 11/18/2015 1424   GLUCOSE 135 (H) 11/18/2015 1424   BUN 12 11/18/2015 1424   CREATININE 1.10 11/18/2015 1424   CALCIUM 8.8 (L) 11/18/2015 1424   PROT 6.8 11/18/2015 1424   ALBUMIN 3.8 11/18/2015 1424   AST 24 11/18/2015 1424   ALT 20 11/18/2015 1424   ALKPHOS 77 11/18/2015 1424   BILITOT 0.8 11/18/2015 1424   GFRNONAA >60 11/18/2015 1424   GFRAA >60 11/18/2015 1424       Component Value Date/Time   WBC 8.7 11/18/2015 1424  HGB 16.9 11/18/2015 1424   HGB 17.7 (H) 06/04/2012 1410   HCT 50.1 11/18/2015 1424   HCT 50.9 06/04/2012 1410   MCV 94.4 11/18/2015 1424    Lipid Panel  No results found for: CHOL, TRIG, HDL, CHOLHDL, VLDL, LDLCALC, LDLDIRECT  ABG    Component Value Date/Time   PHART 7.479 (H) 11/18/2015 1425   PCO2ART 34.1 (L) 11/18/2015 1425   PO2ART 93.2 11/18/2015 1425   HCO3 25.0 (H) 11/18/2015 1425   TCO2 26.1 11/18/2015 1425   O2SAT 97.7 11/18/2015 1425     No results found for: TSH BNP (last 3 results) No results for input(s): BNP in the last 8760 hours.  ProBNP (last 3 results) No results for input(s): PROBNP in the last 8760 hours.  Cardiac Panel (last 3 results) No results for input(s): CKTOTAL, CKMB, TROPONINI, RELINDX in the last 72 hours.  Iron/TIBC/Ferritin/ %Sat No results found for: IRON, TIBC, FERRITIN, IRONPCTSAT   EKG Orders placed or performed in visit on 12/08/15  . EKG 12-Lead     Prior Assessment and Plan Problem List as of 12/21/2015  Reviewed: 12/08/2015  2:21 PM by Charlie Pitter, PA-C     Cardiovascular and Mediastinum   Hemorrhoids   AAA (abdominal aortic aneurysm) without rupture (HCC)   Essential hypertension   AAA (abdominal aortic aneurysm) (HCC)     Digestive   Rectal bleeding     Other   Hyperlipidemia       Imaging: No results found.

## 2015-12-23 ENCOUNTER — Other Ambulatory Visit (HOSPITAL_COMMUNITY): Payer: BLUE CROSS/BLUE SHIELD

## 2015-12-28 ENCOUNTER — Ambulatory Visit (INDEPENDENT_AMBULATORY_CARE_PROVIDER_SITE_OTHER): Payer: BLUE CROSS/BLUE SHIELD

## 2015-12-28 VITALS — BP 142/86 | HR 88

## 2015-12-28 DIAGNOSIS — I10 Essential (primary) hypertension: Secondary | ICD-10-CM | POA: Diagnosis not present

## 2015-12-28 NOTE — Progress Notes (Signed)
Pt has no complaints. Feeling well.

## 2015-12-28 NOTE — Progress Notes (Signed)
Great! Continue his regimen

## 2015-12-28 NOTE — Patient Instructions (Signed)
Your physician recommends that you continue on your current medications as directed. Please refer to the Current Medication list given to you today.  Thanks for choosing [facility]!!!   

## 2016-01-21 ENCOUNTER — Ambulatory Visit: Payer: BLUE CROSS/BLUE SHIELD | Admitting: Adult Health

## 2016-01-22 ENCOUNTER — Inpatient Hospital Stay (HOSPITAL_COMMUNITY): Admission: RE | Admit: 2016-01-22 | Payer: BLUE CROSS/BLUE SHIELD | Source: Ambulatory Visit

## 2016-01-26 ENCOUNTER — Encounter (HOSPITAL_COMMUNITY)
Admission: RE | Admit: 2016-01-26 | Discharge: 2016-01-26 | Disposition: A | Discharge status: Home / Self Care | Payer: BLUE CROSS/BLUE SHIELD | Source: Ambulatory Visit | Attending: Vascular Surgery | Admitting: Vascular Surgery

## 2016-01-26 ENCOUNTER — Ambulatory Visit (INDEPENDENT_AMBULATORY_CARE_PROVIDER_SITE_OTHER): Payer: BLUE CROSS/BLUE SHIELD | Admitting: Adult Health

## 2016-01-26 ENCOUNTER — Encounter (HOSPITAL_COMMUNITY): Payer: Self-pay

## 2016-01-26 ENCOUNTER — Encounter: Payer: Self-pay | Admitting: Adult Health

## 2016-01-26 VITALS — BP 142/90 | HR 79 | Ht 73.0 in | Wt 193.8 lb

## 2016-01-26 DIAGNOSIS — I10 Essential (primary) hypertension: Secondary | ICD-10-CM | POA: Diagnosis not present

## 2016-01-26 DIAGNOSIS — I714 Abdominal aortic aneurysm, without rupture, unspecified: Secondary | ICD-10-CM

## 2016-01-26 LAB — URINALYSIS, ROUTINE W REFLEX MICROSCOPIC
Bilirubin Urine: NEGATIVE
GLUCOSE, UA: NEGATIVE mg/dL
Hgb urine dipstick: NEGATIVE
KETONES UR: NEGATIVE mg/dL
LEUKOCYTES UA: NEGATIVE
NITRITE: NEGATIVE
PROTEIN: NEGATIVE mg/dL
Specific Gravity, Urine: 1.017 (ref 1.005–1.030)
pH: 5.5 (ref 5.0–8.0)

## 2016-01-26 LAB — SURGICAL PCR SCREEN
MRSA, PCR: NEGATIVE
STAPHYLOCOCCUS AUREUS: NEGATIVE

## 2016-01-26 NOTE — Progress Notes (Addendum)
Anesthesia Chart Review: Patient is a 55 year old male scheduled for EVAR AAA on 01/29/16 by Dr. Donnetta Hutching. Procedure was initially scheduled for 11/23/15, but was cancelled after his arrival due to uncontrolled HTN ~ 220-250/120-130's (despite recent PCP follow-up with medication adjustment). He did not have significant improvement in his BP after 5 mg IV Lopressor or 5 mg IV labetalol. Cardiologist Dr. Sallyanne Kuster was consulted to see patient in [place]. No evidence of renal artery stenosis on CTA. Coreg added with planned with consideration of evaluation for high renin/high aldosterone state in the future (ordered, but still active), and out-patient follow-up with Dr. Domenic Polite. He has since had several visits with multiple advanced practice providers with [facility], last visit today with Jory Sims, NP.  History includes smoking, HTN, GERD, HLD, AAA, anxiety, headaches, left testicular cancer s/p left orchiectomy '14, tonsillectomy '68.   PCP is Dr. Hilma Favors with [facility] in [place].  Cardiologist Dr. Domenic Polite had initially seen patient on 10/13/15 for pre-operative evaluation. He wrote, "He has documented coronary atherosclerosis by chest CT imaging, but normal ECG and no angina symptoms with activities far exceeding 4 METs based on description. Based on this he should be able to proceed without further cardiac testing an acceptable perioperative cardiac risk. He does need better blood pressure control.."  Meds include amlodipine/olmesartan 10-40 mg Q AM, ASA 81 mg Q AM, Coreg 6.25 mg 2 tabs BID, hydralazine 25 mg BID, omeprazole OTC PRN, pantoprazole, sertraline. He was instructed to take Coreg, hydralazine, and Azor on the morning of surgery since diastolic still in the [id number] and previously surgery cancelled due to significantly elevated BP on arrival for surgery in July. (Anesthesiologist Dr. Deatra Canter agreed with morning anti-hypertension medication instructions.)  BP (!)  148/94   Pulse 79   Temp 36.8 C   Resp 20   Ht 6\' 1"  (1.854 m)   Wt 194 lb 4.8 oz (88.1 kg)   SpO2 100%   BMI 25.63 kg/m   12/08/15 EKG: NSR.  10/27/15 CTA abd/pelvis: IMPRESSION: - 5.1 x 5.2 cm fusiform infrarenal abdominal aortic aneurysm with mural thrombus. Slight interval enlargement. Measurements as above. - No other acute intra-abdominal or pelvic finding by arterial phase CT.  02/11/15 CT Chest/abd/pelvis: IMPRESSION: 1. No evidence of metastatic disease in the chest, abdomen or pelvis. 2. Multiple tiny pulmonary nodules have all been stable for over two years, suggesting benign nodules. 3. Slight interval growth of 5.0 cm fusiform infrarenal abdominal aortic aneurysm. Recommend followup by abdomen and pelvis CTA in 3-6 months, and vascular surgery referral/consultation if not already obtained. This recommendation follows ACR consensus guidelines: White Paper of the ACR Incidental Findings Committee II on Vascular Findings. J Am Coll Radiol 2013; 10:789-794. 4. Atherosclerosis, including two-vessel coronary artery disease. Please note that although the presence of coronary artery calcium documents the presence of coronary artery disease, the severity of this disease and any potential stenosis cannot be assessed on this non-gated CT examination. Assessment for potential risk factor modification, dietary therapy or pharmacologic therapy may be warranted, if clinically indicated.  On 01/26/16, our lab was unable to get blood. By notes, he jerked his arm after being stuck. He would not allow anyone to stick him again. He reports a fear of needles. [facility] was notified. Patient to return on 01/27/16 for lab only appointment. UA today was WNL. Nasal PCR was negative.  Will leave chart for follow-up regarding lab results and cardiology note from today (once published in [place]).  Myra Gianotti,  PA-C [facility] Stay Center/Anesthesiology Phone [phone] 01/26/2016 5:42  PM  Addendum: Patient was a no show for his 01/27/16 lab draw, but came in this morning for labs. Cr 1.13. Glucose 115. H/H 18.1/52.7. PT/INR 13.1/0.99, PTT 40. T&S done. In regards to cardiology follow-up, I have communicated with Jory Sims, NP who saw patient 01/26/16. Since his BP is now better controlled, he is okay to proceed with surgery from a cardiac standpoint. She does not think the 24 hour urine and renin-aldosterone are needed, and will plan to have these labs cancelled.   If BP is acceptable and otherwise no acute changes then I would anticipate that he could proceed as planned.  George Hugh [facility] Short Stay Center/Anesthesiology Phone [phone] 01/28/2016 3:06 PM

## 2016-01-26 NOTE — Pre-Procedure Instructions (Addendum)
    ERMINE CORDY  01/26/2016      [place], [place] - [id number] [place] [id number] Phone: [phone] Fax: [place], [place] [id number] [place] [id number] [place] [address] Phone: [phone] Fax: [phone]    Your procedure is scheduled on Friday, sept. 29  Report to [facility] Admitting at 5:30 A.M.  Call this number if you have problems the morning of surgery:  [phone]   Remember:  Do not eat food or drink liquids after midnight on Thurs, Sept. 28  Take these medicines the morning of surgery with A SIP OF WATER: coreg, hydralazine, omeprazole if needed, pantoprazole,aspirin, azor             Stop advil, motrin, ibuprofen, aleve, BC Powers,Goody's .   Do not wear jewelry.  Do not wear lotions, powders, or cologne, or deoderant.  Do not shave 48 hours prior to surgery.  Men may shave face and neck.  Do not bring valuables to the hospital.  [facility] is not responsible for any belongings or valuables.  Contacts, dentures or bridgework may not be worn into surgery.  Leave your suitcase in the car.  After surgery it may be brought to your room.  For patients admitted to the hospital, discharge time will be determined by your treatment team.  Patients discharged the day of surgery will not be allowed to drive home.    Special instructions:  Review preparing for surgery  Please read over the following fact sheets that you were given. Coughing and Deep Breathing and MRSA Information

## 2016-01-26 NOTE — Progress Notes (Addendum)
Lab staff attempted to draw patients blood for surgery and patient jerked arm when the lab staff stuck him.   Lab was unable to get blood because the patient said that it hurt and that he was not going to allow anyone to stick him again today.  Two RNs come to lab to help with patient .  Spoke with patient to explain to him that without labs he would run the risk of his surgery being cancelled.  Patient stated that he did not care and that he had a fear of needles.   Patient appeared very nervous, anxious and pacing the floor after being stuck.  RNs Offered to [facility] patient with a smaller needle because lab work needed to be drawn.  Patient refused another blood draw.  Also Offered patient to take sometime to relax before we attempted to [place] and patient agreed but when patient went to the waiting room he then left the unit and has not returned for redraw.  Will notify Patient preadmission RN of situation for her to call office     12:58 PM I called office and spoke with Colletta [place], She stated that she will call patients contact numbers to speak with him about his labs  12:59 PM Colletta Maryland called back stated that patient would not be coming back today for labs to be drawn but he would come tomorrow if we set up an appointment for him.  I will call patient's wife with directions to come back tomorrow at 8am    1:16 PM Made appointment for 8am 01/27/16 for labs only  Called patient's wife with appointment date and time.  Explained to wife that the type of labs that he will be having drawn will hurt because it is being drawn from an artery instead of a vein.  The artery is much deeper.  Wife verbalized understanding.

## 2016-01-26 NOTE — Progress Notes (Signed)
Name: Shane Faulkner    DOB: 1961-02-21  Age: 55 y.o.  MR#: [id number]       PCP:  Purvis Kilts, MD      Insurance: Payor: BLUE [place] / Plan: BCBS OTHER / Product Type: *No Product type* /   CC:   No chief complaint on file.   VS Vitals:   01/26/16 1523  Weight: 193 lb 12.8 oz (87.9 kg)  Height: 6\' 1"  (1.854 m)    Weights Current Weight  01/26/16 193 lb 12.8 oz (87.9 kg)  01/26/16 194 lb 4.8 oz (88.1 kg)  12/21/15 192 lb (87.1 kg)    Blood Pressure  BP Readings from Last 3 Encounters:  01/26/16 (!) 148/94  12/28/15 (!) 142/86  12/21/15 (!) 170/100     Admit date:  (Not on file) Last encounter with RMR:  12/21/2015   Allergy Other and Penicillins  Current Outpatient Prescriptions  Medication Sig Dispense Refill  . amLODipine-olmesartan (AZOR) 10-40 MG tablet Take 1 tablet by mouth daily. 30 tablet 11  . aspirin 81 MG EC tablet Take 81 mg by mouth every morning. Swallow whole.    . carvedilol (COREG) 6.25 MG tablet Take 2 tablets (12.5 mg total) by mouth 2 (two) times daily with a meal. 60 tablet 3  . hydrALAZINE (APRESOLINE) 25 MG tablet Take 1 tablet (25 mg total) by mouth 2 (two) times daily. 180 tablet 3  . omeprazole (PRILOSEC) 20 MG capsule Take 20 mg by mouth daily as needed (indigestion).    . pantoprazole (PROTONIX) 40 MG tablet Take 40 mg by mouth daily.    . sertraline (ZOLOFT) 25 MG tablet Take 25 mg by mouth at bedtime as needed (for mood).     No current facility-administered medications for this visit.     Discontinued Meds:   There are no discontinued medications.  Patient Active Problem List   Diagnosis Date Noted  . Hyperlipidemia   . Essential hypertension   . AAA (abdominal aortic aneurysm) ([place])   . AAA (abdominal aortic aneurysm) without rupture ([place]) 07/10/2012  . Hemorrhoids 11/01/2011  . Rectal bleeding 11/01/2011    LABS    Component Value Date/Time   NA 130 (L) 11/18/2015 1424   NA 137 06/04/2012 1410   K 3.4  (L) 11/18/2015 1424   K 4.7 06/04/2012 1410   CL 99 (L) 11/18/2015 1424   CL 97 06/04/2012 1410   CO2 22 11/18/2015 1424   CO2 29 06/04/2012 1410   GLUCOSE 135 (H) 11/18/2015 1424   GLUCOSE 114 (H) 06/04/2012 1410   BUN 12 11/18/2015 1424   BUN 14 06/04/2012 1410   CREATININE 1.10 11/18/2015 1424   CREATININE 1.28 06/04/2012 1410   CALCIUM 8.8 (L) 11/18/2015 1424   CALCIUM 10.2 06/04/2012 1410   GFRNONAA >60 11/18/2015 1424   GFRNONAA 63 (L) 06/04/2012 1410   GFRAA >60 11/18/2015 1424   GFRAA 73 (L) 06/04/2012 1410   CMP     Component Value Date/Time   NA 130 (L) 11/18/2015 1424   K 3.4 (L) 11/18/2015 1424   CL 99 (L) 11/18/2015 1424   CO2 22 11/18/2015 1424   GLUCOSE 135 (H) 11/18/2015 1424   BUN 12 11/18/2015 1424   CREATININE 1.10 11/18/2015 1424   CALCIUM 8.8 (L) 11/18/2015 1424   PROT 6.8 11/18/2015 1424   ALBUMIN 3.8 11/18/2015 1424   AST 24 11/18/2015 1424   ALT 20 11/18/2015 1424   ALKPHOS 77 11/18/2015 1424  BILITOT 0.8 11/18/2015 1424   GFRNONAA >60 11/18/2015 1424   GFRAA >60 11/18/2015 1424       Component Value Date/Time   WBC 8.7 11/18/2015 1424   HGB 16.9 11/18/2015 1424   HGB 17.7 (H) 06/04/2012 1410   HCT 50.1 11/18/2015 1424   HCT 50.9 06/04/2012 1410   MCV 94.4 11/18/2015 1424    Lipid Panel  No results found for: CHOL, TRIG, HDL, CHOLHDL, VLDL, LDLCALC, LDLDIRECT  ABG    Component Value Date/Time   PHART 7.479 (H) 11/18/2015 1425   PCO2ART 34.1 (L) 11/18/2015 1425   PO2ART 93.2 11/18/2015 1425   HCO3 25.0 (H) 11/18/2015 1425   TCO2 26.1 11/18/2015 1425   O2SAT 97.7 11/18/2015 1425     No results found for: TSH BNP (last 3 results) No results for input(s): BNP in the last 8760 hours.  ProBNP (last 3 results) No results for input(s): PROBNP in the last 8760 hours.  Cardiac Panel (last 3 results) No results for input(s): CKTOTAL, CKMB, TROPONINI, RELINDX in the last 72 hours.  Iron/TIBC/Ferritin/ %Sat No results found for:  IRON, TIBC, FERRITIN, IRONPCTSAT   EKG Orders placed or performed in visit on 12/08/15  . EKG 12-Lead     Prior Assessment and Plan Problem List as of 01/26/2016 Reviewed: 12/21/2015  2:38 PM by Jory Sims, NP     Cardiovascular and Mediastinum   Hemorrhoids   AAA (abdominal aortic aneurysm) without rupture ([place])   Essential hypertension   AAA (abdominal aortic aneurysm) (HCC)     Digestive   Rectal bleeding     Other   Hyperlipidemia       Imaging: No results found.

## 2016-01-26 NOTE — Progress Notes (Signed)
PCP:Dr. Armandina Gemma @ [facility] in [place] Cardiologist: Dr. Domenic Polite and Jory Sims NP  Pt. States he has not had a sleep study ,but one is ordered on Oct. 9 @ [facility].  Called Dr. Luther Parody office on clarification concerning aspirin, spoke to [facility] to continue and take day of surgery.  Also discussed with Alyce Pagan. Pa if pt. shoul hold azor day of surgery, she stated for pt. To take it .

## 2016-01-26 NOTE — Patient Instructions (Signed)
Your physician recommends that you schedule a follow-up appointment in: 6 Weeks with Dr. Domenic Polite.  Your physician recommends that you continue on your current medications as directed. Please refer to the Current Medication list given to you today.  If you need a refill on your cardiac medications before your next appointment, please call your pharmacy.  Thank you for choosing [facility]!

## 2016-01-26 NOTE — Progress Notes (Signed)
Is Cardiology Office Note   Date:  01/26/2016   ID:  Shane Faulkner, DOB Jun 25, 1960, MRN [id number]  PCP:  Purvis Kilts, MD  Cardiologist:[place]/   Jory Sims, NP   Chief Complaint  Patient presents with  . Hypertension  . AAA      History of Present Illness: Shane Faulkner is a 55 y.o. male who presents for ongoing assessment and management of hypertension and history of AAA. Other history includes anxiety GERD and testicular cancer. During hospitalization for AAA repair he was found to have hypertensive urgency. He was started on carvedilol twice a day.  The patient was scheduled for abdominal aortic endovascular stent graft on 01/29/2016 was seen last for blood pressure control. On last office visit the blood pressure was not controlled, he was started hydralazine 25 mg twice a day, with follow-up appointment to evaluate his response to treatment. He came by for a blood pressure check on 12/28/2015 with blood pressure of 142/86. Surgery has been scheduled.  There was also discussion about having a sleep study completed after his surgery.  He comes today ready for surgery. Blood pressure is much better controlled, at home his blood pressures 134/90. He has been keeping a record. He is medically compliant.   Past Medical History:  Diagnosis Date  . AAA (abdominal aortic aneurysm) ([place])    Followed by Dr. Donnetta Hutching  . Anxiety   . Essential hypertension   . GERD (gastroesophageal reflux disease)   . Headache(784.0)   . Hyperlipidemia   . Left testicular cancer ([place]) 06/2012   a. s/p L orchiectomy    Past Surgical History:  Procedure Laterality Date  . ORCHIECTOMY  06/05/2012   Procedure: ORCHIECTOMY;  Surgeon: Franchot Gallo, MD;  Location: [facility];  Service: Urology;  Laterality: Left;  Left Inguinal Orchiectomy  . TONSILLECTOMY  1968     Current Outpatient Prescriptions  Medication Sig Dispense Refill  . amLODipine-olmesartan (AZOR) 10-40 MG tablet Take  1 tablet by mouth daily. 30 tablet 11  . aspirin 81 MG EC tablet Take 81 mg by mouth every morning. Swallow whole.    . carvedilol (COREG) 6.25 MG tablet Take 2 tablets (12.5 mg total) by mouth 2 (two) times daily with a meal. 60 tablet 3  . hydrALAZINE (APRESOLINE) 25 MG tablet Take 1 tablet (25 mg total) by mouth 2 (two) times daily. 180 tablet 3  . omeprazole (PRILOSEC) 20 MG capsule Take 20 mg by mouth daily as needed (indigestion).    . pantoprazole (PROTONIX) 40 MG tablet Take 40 mg by mouth daily.    . sertraline (ZOLOFT) 25 MG tablet Take 25 mg by mouth at bedtime as needed (for mood).     No current facility-administered medications for this visit.     Allergies:   Other and Penicillins    Social History:  The patient  reports that he has been smoking Cigarettes.  He started smoking about 37 years ago. He has a 48.00 pack-year smoking history. He has never used smokeless tobacco. He reports that he drinks about 14.4 oz of alcohol per week . He reports that he does not use drugs.   Family History:  The patient's family history includes AAA (abdominal aortic aneurysm) in his father; Hyperlipidemia in his father.    ROS: All other systems are reviewed and negative. Unless otherwise mentioned in H&P    PHYSICAL EXAM: VS:  BP (!) 142/90   Pulse 79   Ht 6\' 1"  (A999333 m)  Wt 193 lb 12.8 oz (87.9 kg)   SpO2 99%   BMI 25.57 kg/m  , BMI Body mass index is 25.57 kg/m. GEN: Well nourished, well developed, in no acu te distress  HEENT: normal  Neck: no JVD, carotid bruits, or masses Cardiac: RRR; no murmurs, rubs, or gallops,no edema  Respiratory:  clear to auscultation bilaterally, normal work of breathing GI: soft, nontender, nondistended, + BS MS: no deformity or atrophy  Skin: warm and dry, no rash Neuro:  Strength and sensation are intact Psych: euthymic mood, full affect   Recent Labs: 11/18/2015: ALT 20; BUN 12; Creatinine, Ser 1.10; Hemoglobin 16.9; Platelets 265;  Potassium 3.4; Sodium 130    Lipid Panel No results found for: CHOL, TRIG, HDL, CHOLHDL, VLDL, LDLCALC, LDLDIRECT    Wt Readings from Last 3 Encounters:  01/26/16 193 lb 12.8 oz (87.9 kg)  01/26/16 194 lb 4.8 oz (88.1 kg)  12/21/15 192 lb (87.1 kg)     ASSESSMENT AND PLAN:  1. Hypertension: Much better controlled. Continue current regimen.  2. AAA: Repair scheduled for this week. Will follow up after this surgery.    Curre and fell nt medicines are reviewed at length with the patient today.    Labs/ tests ordered today include:  No orders of the defined types were placed in this encounter.    Disposition:   FU with post surgery  Signed, Jory Sims, NP  01/26/2016 5:25 PM    [facility] [address] [place], [place] [address] Phone: [phone]; Fax: [phone]

## 2016-01-27 ENCOUNTER — Inpatient Hospital Stay (HOSPITAL_COMMUNITY)
Admission: RE | Admit: 2016-01-27 | Discharge: 2016-01-27 | Disposition: A | Discharge status: Home / Self Care | Payer: BLUE CROSS/BLUE SHIELD | Source: Ambulatory Visit

## 2016-01-27 NOTE — Progress Notes (Signed)
I was just notified that this patient did not show up to have his blood drew this morning for his surgery on Friday.  Tilda Franco at Dr. Luther Parody office to make her aware

## 2016-01-28 ENCOUNTER — Encounter (HOSPITAL_COMMUNITY)
Admission: RE | Admit: 2016-01-28 | Discharge: 2016-01-28 | Disposition: A | Discharge status: Home / Self Care | Payer: BLUE CROSS/BLUE SHIELD | Source: Ambulatory Visit | Attending: Vascular Surgery | Admitting: Vascular Surgery

## 2016-01-28 LAB — CBC
HCT: 52.7 % — ABNORMAL HIGH (ref 39.0–52.0)
Hemoglobin: 18.1 g/dL — ABNORMAL HIGH (ref 13.0–17.0)
MCH: 33.7 pg (ref 26.0–34.0)
MCHC: 34.3 g/dL (ref 30.0–36.0)
MCV: 98.1 fL (ref 78.0–100.0)
PLATELETS: 342 10*3/uL (ref 150–400)
RBC: 5.37 MIL/uL (ref 4.22–5.81)
RDW: 16 % — AB (ref 11.5–15.5)
WBC: 7.6 10*3/uL (ref 4.0–10.5)

## 2016-01-28 LAB — COMPREHENSIVE METABOLIC PANEL
ALT: 33 U/L (ref 17–63)
AST: 26 U/L (ref 15–41)
Albumin: 4.4 g/dL (ref 3.5–5.0)
Alkaline Phosphatase: 72 U/L (ref 38–126)
Anion gap: 10 (ref 5–15)
BUN: 13 mg/dL (ref 6–20)
CHLORIDE: 107 mmol/L (ref 101–111)
CO2: 21 mmol/L — AB (ref 22–32)
CREATININE: 1.13 mg/dL (ref 0.61–1.24)
Calcium: 9.9 mg/dL (ref 8.9–10.3)
Glucose, Bld: 115 mg/dL — ABNORMAL HIGH (ref 65–99)
POTASSIUM: 4.1 mmol/L (ref 3.5–5.1)
SODIUM: 138 mmol/L (ref 135–145)
Total Bilirubin: 1.1 mg/dL (ref 0.3–1.2)
Total Protein: 7.7 g/dL (ref 6.5–8.1)

## 2016-01-28 LAB — APTT: APTT: 40 s — AB (ref 24–36)

## 2016-01-28 LAB — PROTIME-INR
INR: 0.99
PROTHROMBIN TIME: 13.1 s (ref 11.4–15.2)

## 2016-01-28 LAB — TYPE AND SCREEN
ABO/RH(D): A POS
ANTIBODY SCREEN: NEGATIVE

## 2016-01-29 ENCOUNTER — Encounter (HOSPITAL_COMMUNITY): Payer: Self-pay | Admitting: *Deleted

## 2016-01-29 ENCOUNTER — Other Ambulatory Visit: Payer: Self-pay | Admitting: *Deleted

## 2016-01-29 ENCOUNTER — Encounter (HOSPITAL_COMMUNITY)
Admission: RE | Disposition: A | Discharge status: Home / Self Care | Payer: Self-pay | Source: Ambulatory Visit | Attending: Vascular Surgery

## 2016-01-29 ENCOUNTER — Inpatient Hospital Stay (HOSPITAL_COMMUNITY): Payer: BLUE CROSS/BLUE SHIELD | Admitting: Certified Registered"

## 2016-01-29 ENCOUNTER — Inpatient Hospital Stay (HOSPITAL_COMMUNITY)
Admission: RE | Admit: 2016-01-29 | Discharge: 2016-01-30 | DRG: 269 | Disposition: A | Discharge status: Home / Self Care | Payer: BLUE CROSS/BLUE SHIELD | Source: Ambulatory Visit | Attending: Vascular Surgery | Admitting: Vascular Surgery

## 2016-01-29 ENCOUNTER — Inpatient Hospital Stay (HOSPITAL_COMMUNITY): Payer: BLUE CROSS/BLUE SHIELD | Admitting: Vascular Surgery

## 2016-01-29 ENCOUNTER — Inpatient Hospital Stay (HOSPITAL_COMMUNITY): Payer: BLUE CROSS/BLUE SHIELD

## 2016-01-29 ENCOUNTER — Telehealth: Payer: Self-pay | Admitting: *Deleted

## 2016-01-29 DIAGNOSIS — T85848A Pain due to other internal prosthetic devices, implants and grafts, initial encounter: Secondary | ICD-10-CM | POA: Diagnosis not present

## 2016-01-29 DIAGNOSIS — Z7982 Long term (current) use of aspirin: Secondary | ICD-10-CM | POA: Diagnosis not present

## 2016-01-29 DIAGNOSIS — M79642 Pain in left hand: Secondary | ICD-10-CM | POA: Diagnosis not present

## 2016-01-29 DIAGNOSIS — K219 Gastro-esophageal reflux disease without esophagitis: Secondary | ICD-10-CM | POA: Diagnosis present

## 2016-01-29 DIAGNOSIS — E785 Hyperlipidemia, unspecified: Secondary | ICD-10-CM | POA: Diagnosis present

## 2016-01-29 DIAGNOSIS — Y92239 Unspecified place in hospital as the place of occurrence of the external cause: Secondary | ICD-10-CM | POA: Diagnosis not present

## 2016-01-29 DIAGNOSIS — G8918 Other acute postprocedural pain: Secondary | ICD-10-CM | POA: Diagnosis not present

## 2016-01-29 DIAGNOSIS — Y828 Other medical devices associated with adverse incidents: Secondary | ICD-10-CM | POA: Diagnosis not present

## 2016-01-29 DIAGNOSIS — I714 Abdominal aortic aneurysm, without rupture, unspecified: Secondary | ICD-10-CM

## 2016-01-29 DIAGNOSIS — Z48812 Encounter for surgical aftercare following surgery on the circulatory system: Secondary | ICD-10-CM

## 2016-01-29 DIAGNOSIS — I10 Essential (primary) hypertension: Secondary | ICD-10-CM | POA: Diagnosis present

## 2016-01-29 DIAGNOSIS — Z9889 Other specified postprocedural states: Secondary | ICD-10-CM

## 2016-01-29 DIAGNOSIS — F17210 Nicotine dependence, cigarettes, uncomplicated: Secondary | ICD-10-CM | POA: Diagnosis present

## 2016-01-29 DIAGNOSIS — Z8679 Personal history of other diseases of the circulatory system: Secondary | ICD-10-CM

## 2016-01-29 DIAGNOSIS — Z8547 Personal history of malignant neoplasm of testis: Secondary | ICD-10-CM

## 2016-01-29 HISTORY — DX: Other specified phobia: F40.298

## 2016-01-29 HISTORY — PX: ABDOMINAL AORTIC ENDOVASCULAR STENT GRAFT: SHX5707

## 2016-01-29 LAB — CBC
HEMATOCRIT: 45.8 % (ref 39.0–52.0)
Hemoglobin: 15.1 g/dL (ref 13.0–17.0)
MCH: 32.3 pg (ref 26.0–34.0)
MCHC: 33 g/dL (ref 30.0–36.0)
MCV: 97.9 fL (ref 78.0–100.0)
PLATELETS: 260 10*3/uL (ref 150–400)
RBC: 4.68 MIL/uL (ref 4.22–5.81)
RDW: 15.8 % — ABNORMAL HIGH (ref 11.5–15.5)
WBC: 6.3 10*3/uL (ref 4.0–10.5)

## 2016-01-29 LAB — BASIC METABOLIC PANEL
ANION GAP: 7 (ref 5–15)
BUN: 13 mg/dL (ref 6–20)
CO2: 22 mmol/L (ref 22–32)
Calcium: 8.7 mg/dL — ABNORMAL LOW (ref 8.9–10.3)
Chloride: 107 mmol/L (ref 101–111)
Creatinine, Ser: 1.2 mg/dL (ref 0.61–1.24)
GLUCOSE: 122 mg/dL — AB (ref 65–99)
POTASSIUM: 4.1 mmol/L (ref 3.5–5.1)
Sodium: 136 mmol/L (ref 135–145)

## 2016-01-29 LAB — POCT I-STAT 7, (LYTES, BLD GAS, ICA,H+H)
Acid-base deficit: 3 mmol/L — ABNORMAL HIGH (ref 0.0–2.0)
BICARBONATE: 21.7 mmol/L (ref 20.0–28.0)
Calcium, Ion: 1.22 mmol/L (ref 1.15–1.40)
HEMATOCRIT: 49 % (ref 39.0–52.0)
Hemoglobin: 16.7 g/dL (ref 13.0–17.0)
O2 SAT: 96 %
PCO2 ART: 35.2 mmHg (ref 32.0–48.0)
PO2 ART: 78 mmHg — AB (ref 83.0–108.0)
POTASSIUM: 4 mmol/L (ref 3.5–5.1)
Patient temperature: 36
Sodium: 138 mmol/L (ref 135–145)
TCO2: 23 mmol/L (ref 0–100)
pH, Arterial: 7.393 (ref 7.350–7.450)

## 2016-01-29 LAB — APTT: APTT: 40 s — AB (ref 24–36)

## 2016-01-29 LAB — MAGNESIUM: MAGNESIUM: 1.9 mg/dL (ref 1.7–2.4)

## 2016-01-29 LAB — PROTIME-INR
INR: 1.04
Prothrombin Time: 13.7 seconds (ref 11.4–15.2)

## 2016-01-29 SURGERY — INSERTION, ENDOVASCULAR STENT GRAFT, AORTA, ABDOMINAL
Anesthesia: General | Site: Abdomen

## 2016-01-29 MED ORDER — ATORVASTATIN CALCIUM 10 MG PO TABS: 10.0000 mg | ORAL_TABLET | Freq: Every day | ORAL | 11 refills | Status: DC

## 2016-01-29 MED ORDER — GUAIFENESIN-DM 100-10 MG/5ML PO SYRP: 15.0000 mL | ORAL_SOLUTION | ORAL | Status: DC | PRN

## 2016-01-29 MED ORDER — SODIUM CHLORIDE 0.9 % IV SOLN: INTRAVENOUS | Status: DC

## 2016-01-29 MED ORDER — CHLORHEXIDINE GLUCONATE CLOTH 2 % EX PADS: 6.0000 | MEDICATED_PAD | Freq: Once | CUTANEOUS | Status: DC

## 2016-01-29 MED ORDER — MAGNESIUM SULFATE 2 GM/50ML IV SOLN: 2.0000 g | Freq: Every day | INTRAVENOUS | Status: DC | PRN

## 2016-01-29 MED ORDER — PHENOL 1.4 % MT LIQD: 1.0000 | OROMUCOSAL | Status: DC | PRN

## 2016-01-29 MED ORDER — OXYCODONE HCL 5 MG PO TABS: 5.0000 mg | ORAL_TABLET | Freq: Once | ORAL | Status: DC | PRN

## 2016-01-29 MED ORDER — MIDAZOLAM HCL 5 MG/5ML IJ SOLN
INTRAMUSCULAR | Status: DC | PRN
  Administered 2016-01-29: 2 mg via INTRAVENOUS

## 2016-01-29 MED ORDER — IRBESARTAN 300 MG PO TABS
300.0000 mg | ORAL_TABLET | Freq: Every day | ORAL | Status: DC
  Administered 2016-01-30: 300 mg via ORAL
  Filled 2016-01-29: qty 1

## 2016-01-29 MED ORDER — PHENYLEPHRINE HCL 10 MG/ML IJ SOLN
INTRAMUSCULAR | Status: DC | PRN
  Administered 2016-01-29 (×3): 80 ug via INTRAVENOUS

## 2016-01-29 MED ORDER — HYDRALAZINE HCL 20 MG/ML IJ SOLN: 5.0000 mg | INTRAMUSCULAR | Status: DC | PRN

## 2016-01-29 MED ORDER — FENTANYL CITRATE (PF) 100 MCG/2ML IJ SOLN
INTRAMUSCULAR | Status: DC | PRN
  Administered 2016-01-29: 50 ug via INTRAVENOUS
  Administered 2016-01-29: 100 ug via INTRAVENOUS

## 2016-01-29 MED ORDER — MIDAZOLAM HCL 2 MG/2ML IJ SOLN
INTRAMUSCULAR | Status: AC
  Filled 2016-01-29: qty 2

## 2016-01-29 MED ORDER — PANTOPRAZOLE SODIUM 40 MG PO TBEC
40.0000 mg | DELAYED_RELEASE_TABLET | Freq: Every day | ORAL | Status: DC
  Administered 2016-01-29 – 2016-01-30 (×2): 40 mg via ORAL
  Filled 2016-01-29 (×2): qty 1

## 2016-01-29 MED ORDER — LACTATED RINGERS IV SOLN
INTRAVENOUS | Status: DC | PRN
  Administered 2016-01-29 (×2): via INTRAVENOUS

## 2016-01-29 MED ORDER — HEPARIN SODIUM (PORCINE) 1000 UNIT/ML IJ SOLN
INTRAMUSCULAR | Status: AC
  Filled 2016-01-29: qty 1

## 2016-01-29 MED ORDER — HYDRALAZINE HCL 25 MG PO TABS
25.0000 mg | ORAL_TABLET | Freq: Two times a day (BID) | ORAL | Status: DC
  Administered 2016-01-29 – 2016-01-30 (×2): 25 mg via ORAL
  Filled 2016-01-29 (×2): qty 1

## 2016-01-29 MED ORDER — SODIUM CHLORIDE 0.9 % IV SOLN: 500.0000 mL | Freq: Once | INTRAVENOUS | Status: DC | PRN

## 2016-01-29 MED ORDER — SUCCINYLCHOLINE CHLORIDE 200 MG/10ML IV SOSY
PREFILLED_SYRINGE | INTRAVENOUS | Status: AC
  Filled 2016-01-29: qty 10

## 2016-01-29 MED ORDER — PHENYLEPHRINE 40 MCG/ML (10ML) SYRINGE FOR IV PUSH (FOR BLOOD PRESSURE SUPPORT)
PREFILLED_SYRINGE | INTRAVENOUS | Status: AC
  Filled 2016-01-29: qty 10

## 2016-01-29 MED ORDER — FENTANYL CITRATE (PF) 250 MCG/5ML IJ SOLN
INTRAMUSCULAR | Status: AC
  Filled 2016-01-29: qty 5

## 2016-01-29 MED ORDER — METOPROLOL TARTRATE 5 MG/5ML IV SOLN: 2.0000 mg | INTRAVENOUS | Status: DC | PRN

## 2016-01-29 MED ORDER — VANCOMYCIN HCL IN DEXTROSE 1-5 GM/200ML-% IV SOLN
1000.0000 mg | INTRAVENOUS | Status: AC
  Administered 2016-01-29: 1000 mg via INTRAVENOUS
  Filled 2016-01-29: qty 200

## 2016-01-29 MED ORDER — PHENYLEPHRINE HCL 10 MG/ML IJ SOLN
INTRAVENOUS | Status: DC | PRN
  Administered 2016-01-29: 50 ug/min via INTRAVENOUS

## 2016-01-29 MED ORDER — SODIUM CHLORIDE 0.9 % IV SOLN
INTRAVENOUS | Status: DC | PRN
  Administered 2016-01-29: 500 mL

## 2016-01-29 MED ORDER — OXYCODONE HCL 5 MG/5ML PO SOLN: 5.0000 mg | Freq: Once | ORAL | Status: DC | PRN

## 2016-01-29 MED ORDER — SUGAMMADEX SODIUM 200 MG/2ML IV SOLN
INTRAVENOUS | Status: AC
  Filled 2016-01-29: qty 2

## 2016-01-29 MED ORDER — ONDANSETRON HCL 4 MG/2ML IJ SOLN: 4.0000 mg | Freq: Four times a day (QID) | INTRAMUSCULAR | Status: DC | PRN

## 2016-01-29 MED ORDER — ROCURONIUM BROMIDE 100 MG/10ML IV SOLN
INTRAVENOUS | Status: DC | PRN
  Administered 2016-01-29: 50 mg via INTRAVENOUS

## 2016-01-29 MED ORDER — ONDANSETRON HCL 4 MG/2ML IJ SOLN
INTRAMUSCULAR | Status: DC | PRN
  Administered 2016-01-29: 4 mg via INTRAVENOUS

## 2016-01-29 MED ORDER — ACETAMINOPHEN 325 MG RE SUPP: 325.0000 mg | RECTAL | Status: DC | PRN

## 2016-01-29 MED ORDER — SODIUM CHLORIDE 0.9 % IV SOLN
INTRAVENOUS | Status: DC
  Administered 2016-01-29: 16:00:00 via INTRAVENOUS

## 2016-01-29 MED ORDER — PROPOFOL 10 MG/ML IV BOLUS
INTRAVENOUS | Status: DC | PRN
  Administered 2016-01-29: 100 mg via INTRAVENOUS
  Administered 2016-01-29: 200 mg via INTRAVENOUS

## 2016-01-29 MED ORDER — SERTRALINE HCL 25 MG PO TABS: 25.0000 mg | ORAL_TABLET | Freq: Every evening | ORAL | Status: DC | PRN

## 2016-01-29 MED ORDER — PROPOFOL 10 MG/ML IV BOLUS
INTRAVENOUS | Status: AC
  Filled 2016-01-29: qty 20

## 2016-01-29 MED ORDER — ENOXAPARIN SODIUM 40 MG/0.4ML ~~LOC~~ SOLN: 40.0000 mg | SUBCUTANEOUS | Status: DC

## 2016-01-29 MED ORDER — VANCOMYCIN HCL IN DEXTROSE 1-5 GM/200ML-% IV SOLN
1000.0000 mg | Freq: Two times a day (BID) | INTRAVENOUS | Status: AC
  Administered 2016-01-29 – 2016-01-30 (×2): 1000 mg via INTRAVENOUS
  Filled 2016-01-29 (×2): qty 200

## 2016-01-29 MED ORDER — OXYCODONE-ACETAMINOPHEN 5-325 MG PO TABS
1.0000 | ORAL_TABLET | ORAL | Status: DC | PRN
  Administered 2016-01-29: 2 via ORAL
  Filled 2016-01-29: qty 2

## 2016-01-29 MED ORDER — MORPHINE SULFATE (PF) 2 MG/ML IV SOLN: 1.0000 mg | INTRAVENOUS | Status: DC | PRN

## 2016-01-29 MED ORDER — DOCUSATE SODIUM 100 MG PO CAPS
100.0000 mg | ORAL_CAPSULE | Freq: Every day | ORAL | Status: DC
  Administered 2016-01-30: 100 mg via ORAL
  Filled 2016-01-29: qty 1

## 2016-01-29 MED ORDER — LIDOCAINE HCL (CARDIAC) 20 MG/ML IV SOLN
INTRAVENOUS | Status: DC | PRN
  Administered 2016-01-29: 60 mg via INTRAVENOUS

## 2016-01-29 MED ORDER — AMLODIPINE-OLMESARTAN 10-40 MG PO TABS: 1.0000 | ORAL_TABLET | Freq: Every day | ORAL | Status: DC

## 2016-01-29 MED ORDER — ROCURONIUM BROMIDE 10 MG/ML (PF) SYRINGE
PREFILLED_SYRINGE | INTRAVENOUS | Status: AC
  Filled 2016-01-29: qty 10

## 2016-01-29 MED ORDER — ONDANSETRON HCL 4 MG/2ML IJ SOLN
INTRAMUSCULAR | Status: AC
  Filled 2016-01-29: qty 2

## 2016-01-29 MED ORDER — LABETALOL HCL 5 MG/ML IV SOLN
10.0000 mg | INTRAVENOUS | Status: DC | PRN
  Administered 2016-01-30: 10 mg via INTRAVENOUS
  Filled 2016-01-29: qty 4

## 2016-01-29 MED ORDER — ACETAMINOPHEN 325 MG PO TABS: 325.0000 mg | ORAL_TABLET | ORAL | Status: DC | PRN

## 2016-01-29 MED ORDER — PROTAMINE SULFATE 10 MG/ML IV SOLN
INTRAVENOUS | Status: AC
Start: 2016-01-29 — End: 2016-01-29
  Filled 2016-01-29: qty 5

## 2016-01-29 MED ORDER — ALUM & MAG HYDROXIDE-SIMETH 200-200-20 MG/5ML PO SUSP: 15.0000 mL | ORAL | Status: DC | PRN

## 2016-01-29 MED ORDER — HEPARIN SODIUM (PORCINE) 1000 UNIT/ML IJ SOLN
INTRAMUSCULAR | Status: DC | PRN
  Administered 2016-01-29: 6000 [IU] via INTRAVENOUS

## 2016-01-29 MED ORDER — EPHEDRINE SULFATE 50 MG/ML IJ SOLN
INTRAMUSCULAR | Status: DC | PRN
  Administered 2016-01-29 (×2): 10 mg via INTRAVENOUS

## 2016-01-29 MED ORDER — ASPIRIN EC 81 MG PO TBEC
81.0000 mg | DELAYED_RELEASE_TABLET | ORAL | Status: DC
  Administered 2016-01-30: 81 mg via ORAL
  Filled 2016-01-29: qty 1

## 2016-01-29 MED ORDER — SUGAMMADEX SODIUM 200 MG/2ML IV SOLN
INTRAVENOUS | Status: DC | PRN
  Administered 2016-01-29: 200 mg via INTRAVENOUS

## 2016-01-29 MED ORDER — CARVEDILOL 12.5 MG PO TABS
12.5000 mg | ORAL_TABLET | Freq: Two times a day (BID) | ORAL | Status: DC
  Administered 2016-01-29 – 2016-01-30 (×2): 12.5 mg via ORAL
  Filled 2016-01-29 (×2): qty 1

## 2016-01-29 MED ORDER — FENTANYL CITRATE (PF) 100 MCG/2ML IJ SOLN: 25.0000 ug | INTRAMUSCULAR | Status: DC | PRN

## 2016-01-29 MED ORDER — PROTAMINE SULFATE 10 MG/ML IV SOLN
INTRAVENOUS | Status: DC | PRN
  Administered 2016-01-29 (×3): 10 mg via INTRAVENOUS
  Administered 2016-01-29: 20 mg via INTRAVENOUS

## 2016-01-29 MED ORDER — EPHEDRINE 5 MG/ML INJ
INTRAVENOUS | Status: AC
  Filled 2016-01-29: qty 10

## 2016-01-29 MED ORDER — OXYCODONE-ACETAMINOPHEN 5-325 MG PO TABS: 1.0000 | ORAL_TABLET | Freq: Four times a day (QID) | ORAL | 0 refills | Status: DC | PRN

## 2016-01-29 MED ORDER — LIDOCAINE 2% (20 MG/ML) 5 ML SYRINGE
INTRAMUSCULAR | Status: AC
  Filled 2016-01-29: qty 5

## 2016-01-29 MED ORDER — AMLODIPINE BESYLATE 10 MG PO TABS
10.0000 mg | ORAL_TABLET | Freq: Every day | ORAL | Status: DC
  Administered 2016-01-30: 10 mg via ORAL
  Filled 2016-01-29: qty 1

## 2016-01-29 MED ORDER — POTASSIUM CHLORIDE CRYS ER 20 MEQ PO TBCR: 20.0000 meq | EXTENDED_RELEASE_TABLET | Freq: Every day | ORAL | Status: DC | PRN

## 2016-01-29 MED ORDER — IODIXANOL 320 MG/ML IV SOLN
INTRAVENOUS | Status: DC | PRN
  Administered 2016-01-29: 65.8 mL via INTRAVENOUS

## 2016-01-29 SURGICAL SUPPLY — 63 items
BENZOIN TINCTURE PRP APPL 2/3 (GAUZE/BANDAGES/DRESSINGS) ×6 IMPLANT
CANISTER SUCTION 2500CC (MISCELLANEOUS) ×3 IMPLANT
CATH ANGIO 5F BER2 65CM (CATHETERS) ×3 IMPLANT
CATH BEACON 5.038 65CM KMP-01 (CATHETERS) IMPLANT
CATH OMNI FLUSH .035X70CM (CATHETERS) ×3 IMPLANT
CATH VANSCH 5FR 6CM (CATHETERS) ×3 IMPLANT
CLIP LIGATING EXTRA MED SLVR (CLIP) IMPLANT
CLIP LIGATING EXTRA SM BLUE (MISCELLANEOUS) IMPLANT
CLOSURE STERI-STRIP 1/2X4 (GAUZE/BANDAGES/DRESSINGS) ×1
CLOSURE WOUND 1/2 X4 (GAUZE/BANDAGES/DRESSINGS) ×1
CLSR STERI-STRIP ANTIMIC 1/2X4 (GAUZE/BANDAGES/DRESSINGS) ×2 IMPLANT
COVER PROBE W GEL 5X96 (DRAPES) IMPLANT
DEVICE CLOSURE PERCLS PRGLD 6F (VASCULAR PRODUCTS) ×4 IMPLANT
DRSG TEGADERM 2-3/8X2-3/4 SM (GAUZE/BANDAGES/DRESSINGS) ×6 IMPLANT
DRYSEAL FLEXSHEATH 12FR 33CM (SHEATH) ×2
DRYSEAL FLEXSHEATH 18FR 33CM (SHEATH) ×2
ELECT REM PT RETURN 9FT ADLT (ELECTROSURGICAL) ×6
ELECTRODE REM PT RTRN 9FT ADLT (ELECTROSURGICAL) ×2 IMPLANT
EXCLUDER TNK 28.5X14.5X18CM (Endovascular Graft) ×1 IMPLANT
EXCLUDER TRUNK 28.5X14.5X18CM (Endovascular Graft) ×3 IMPLANT
GAUZE SPONGE 2X2 8PLY STRL LF (GAUZE/BANDAGES/DRESSINGS) ×2 IMPLANT
GLOVE BIO SURGEON STRL SZ 6.5 (GLOVE) ×2 IMPLANT
GLOVE BIO SURGEONS STRL SZ 6.5 (GLOVE) ×1
GLOVE BIOGEL PI IND STRL 6.5 (GLOVE) ×3 IMPLANT
GLOVE BIOGEL PI IND STRL 7.0 (GLOVE) ×1 IMPLANT
GLOVE BIOGEL PI INDICATOR 6.5 (GLOVE) ×6
GLOVE BIOGEL PI INDICATOR 7.0 (GLOVE) ×2
GLOVE ECLIPSE 8.5 STRL (GLOVE) ×3 IMPLANT
GLOVE SS BIOGEL STRL SZ 7.5 (GLOVE) ×1 IMPLANT
GLOVE SUPERSENSE BIOGEL SZ 7.5 (GLOVE) ×2
GLOVE SURG SS PI 6.5 STRL IVOR (GLOVE) ×3 IMPLANT
GOWN STRL REUS W/ TWL LRG LVL3 (GOWN DISPOSABLE) ×4 IMPLANT
GOWN STRL REUS W/TWL LRG LVL3 (GOWN DISPOSABLE) ×8
GRAFT BALLN CATH 65CM (STENTS) ×1 IMPLANT
GRAFT EXCLUDER LEG 12X14 (Endovascular Graft) ×3 IMPLANT
KIT BASIN OR (CUSTOM PROCEDURE TRAY) ×3 IMPLANT
KIT ROOM TURNOVER OR (KITS) ×3 IMPLANT
NEEDLE PERC 18GX7CM (NEEDLE) ×3 IMPLANT
NS IRRIG 1000ML POUR BTL (IV SOLUTION) IMPLANT
PACK ENDOVASCULAR (PACKS) ×3 IMPLANT
PAD ARMBOARD 7.5X6 YLW CONV (MISCELLANEOUS) ×6 IMPLANT
PERCLOSE PROGLIDE 6F (VASCULAR PRODUCTS) ×12
SHEATH AVANTI 11CM 8FR (MISCELLANEOUS) ×3 IMPLANT
SHEATH BRITE TIP 8FR 23CM (MISCELLANEOUS) ×3 IMPLANT
SHEATH DRYSEAL FLEX 12FR 33CM (SHEATH) ×1 IMPLANT
SHEATH DRYSEAL FLEX 18FR 33CM (SHEATH) ×1 IMPLANT
SPONGE GAUZE 2X2 STER 10/PKG (GAUZE/BANDAGES/DRESSINGS) ×4
STAPLER VISISTAT 35W (STAPLE) IMPLANT
STENT GRAFT BALLN CATH 65CM (STENTS) ×2
STOPCOCK MORSE 400PSI 3WAY (MISCELLANEOUS) ×3 IMPLANT
STRIP CLOSURE SKIN 1/2X4 (GAUZE/BANDAGES/DRESSINGS) ×2 IMPLANT
SUT ETHILON 3 0 PS 1 (SUTURE) IMPLANT
SUT PROLENE 5 0 C 1 24 (SUTURE) IMPLANT
SUT VIC AB 2-0 CTX 36 (SUTURE) IMPLANT
SUT VIC AB 3-0 SH 18 (SUTURE) IMPLANT
SUT VIC AB 3-0 SH 27 (SUTURE)
SUT VIC AB 3-0 SH 27X BRD (SUTURE) IMPLANT
SUT VICRYL 4-0 PS2 18IN ABS (SUTURE) ×6 IMPLANT
SYR 30ML LL (SYRINGE) ×3 IMPLANT
TRAY FOLEY W/METER SILVER 16FR (SET/KITS/TRAYS/PACK) ×3 IMPLANT
TUBING HIGH PRESSURE 120CM (CONNECTOR) ×3 IMPLANT
WIRE AMPLATZ SS-J .035X180CM (WIRE) ×6 IMPLANT
WIRE BENTSON .035X145CM (WIRE) ×6 IMPLANT

## 2016-01-29 NOTE — Anesthesia Procedure Notes (Signed)
Procedure Name: Intubation Date/Time: 01/29/2016 7:39 AM Performed by: Myna Bright Pre-anesthesia Checklist: Patient identified, Emergency Drugs available, Suction available and Patient being monitored Patient Re-evaluated:Patient Re-evaluated prior to inductionOxygen Delivery Method: Circle system utilized Preoxygenation: Pre-oxygenation with 100% oxygen Intubation Type: IV induction Ventilation: Mask ventilation without difficulty and Oral airway inserted - appropriate to patient size Laryngoscope Size: Mac and 4 Grade View: Grade I Tube type: Oral Tube size: 7.5 mm Number of attempts: 1 Airway Equipment and Method: Stylet Placement Confirmation: ETT inserted through vocal cords under direct vision,  positive ETCO2 and breath sounds checked- equal and bilateral Secured at: 23 cm Tube secured with: Tape Dental Injury: Teeth and Oropharynx as per pre-operative assessment

## 2016-01-29 NOTE — Telephone Encounter (Signed)
Wanted this back to work information saved for future notes as needed.

## 2016-01-29 NOTE — Anesthesia Preprocedure Evaluation (Signed)
Anesthesia Evaluation  Patient identified by MRN, date of birth, ID band Patient awake    Reviewed: Allergy & Precautions, NPO status , Patient's Chart, lab work & pertinent test results  Airway Mallampati: II   Neck ROM: full    Dental   Pulmonary Current Smoker,    breath sounds clear to auscultation       Cardiovascular hypertension, + Peripheral Vascular Disease   Rhythm:regular Rate:Normal     Neuro/Psych  Headaches, Anxiety    GI/Hepatic GERD  ,  Endo/Other    Renal/GU      Musculoskeletal   Abdominal   Peds  Hematology   Anesthesia Other Findings   Reproductive/Obstetrics                             Anesthesia Physical Anesthesia Plan  ASA: III  Anesthesia Plan: General   Post-op Pain Management:    Induction: Intravenous  Airway Management Planned: Oral ETT  Additional Equipment: Arterial line  Intra-op Plan:   Post-operative Plan: Extubation in OR  Informed Consent: I have reviewed the patients History and Physical, chart, labs and discussed the procedure including the risks, benefits and alternatives for the proposed anesthesia with the patient or authorized representative who has indicated his/her understanding and acceptance.     Plan Discussed with: CRNA, Anesthesiologist and Surgeon  Anesthesia Plan Comments:         Anesthesia Quick Evaluation

## 2016-01-29 NOTE — H&P (Signed)
Office Visit   09/30/2015 [facility] -Sharman Crate, MD  Vascular Surgery   AAA (abdominal aortic aneurysm) without rupture ([place]) +1 more  Dx   Re-evaluation ; Referred by Sharilyn Sites, MD  Reason for Visit   Additional Documentation   Vitals:   BP  153/96    Pulse 97   Ht 6' 1.5" (1.867 m)   Wt 88.5 kg (195 lb)   SpO2 97%   BMI 25.38 kg/m   BSA 2.14 m      More Vitals   Flowsheets:   Amb Nursing Assessment,   Infectious Disease Screening,   Custom Formula Data,   Anthropometrics,   MEWS Score     Encounter Info:   Billing Info,   History,   Allergies,   Detailed Report     All Notes   Addendum Note by Reola Calkins, CMA at 10/05/2015 10:51 AM   Author: Reola Calkins, CMA Author Type: Certified Medical Assistant Filed: 10/05/2015 10:51 AM  Note Status: Signed Cosign: Cosign Not Required Encounter Date: 09/30/2015  Editor: Reola Calkins, CMA (Certified Medical Assistant)    Addended by: Reola Calkins on: 10/05/2015 10:51 AM   Modules accepted: Orders     Addendum Note by Mena Goes, RN at 10/01/2015 2:24 PM   Author: Mena Goes, RN Author Type: Registered Nurse Filed: 10/01/2015 2:24 PM  Note Status: Signed Cosign: Cosign Not Required Encounter Date: 09/30/2015  Editor: Mena Goes, RN (Registered Nurse)    Addended by: Mena Goes on: 10/01/2015 02:24 PM   Modules accepted: Orders     Progress Notes by [name], MD at 09/30/2015 3:34 PM   Author: [name], MD Author Type: Physician Filed: 09/30/2015 4:35 PM  Note Status: Signed Cosign: Cosign Not Required Encounter Date: 09/30/2015  Editor: [name], MD (Physician)  Prior Versions: 1. Ansel Bong (Physician Assistant Certified) at 0000000 3:41 PM - Sign at close encounter      [facility]  Patient name: Shane Faulkner           MRN:  [id number]        DOB: [birth year]          Sex: male  REASON FOR VISIT: Follow-up of AAA  HPI: Shane Faulkner is a 55 y.o. male who presents for follow-up of his abdominal aortic aneurysm. At his last office visit in November 2016, his AAA had a maximum diameter of 5 cm on CT scan. He was offered elective repair versus continued observation. He opted for observation.  He denies any abdominal pain. He has no history of coronary artery disease. He does have has a history of hypertension managed on amlodipine and lisinopril. He takes a daily aspirin. He is a current smoker. He is active.        Past Medical History  Diagnosis Date  . Hypertension   . Hypertension   . GERD (gastroesophageal reflux disease)   . Hyperlipidemia   . Headache(784.0)   . [facility] Feb 14    testicular LT         Family History  Problem Relation Age of Onset  . AAA (abdominal aortic aneurysm) Father   . Hyperlipidemia Father     SOCIAL HISTORY:      Social History  Substance Use Topics  . Smoking status: Current Every Day Smoker -- 1.50 packs/day for 32 years  Types: Cigarettes  . Smokeless tobacco: Never Used  . Alcohol Use: 30.6 oz/week    48 Cans of beer, 3 Shots of liquor per week         Allergies  Allergen Reactions  . Other Anaphylaxis    Cashews  . Penicillins Anaphylaxis          Current Outpatient Prescriptions  Medication Sig Dispense Refill  . amLODipine (NORVASC) 5 MG tablet Take 5 mg by mouth daily.    Marland Kitchen aspirin 81 MG EC tablet Take 81 mg by mouth daily. Swallow whole.    . Aspirin-Salicylamide-Caffeine (BC HEADACHE PO) Take 1 packet by mouth daily as needed (for pain). Once daily as needed for headaches.    Marland Kitchen lisinopril (PRINIVIL,ZESTRIL) 20 MG tablet Take 30 mg by mouth daily.     . ranitidine (ZANTAC) 150 MG tablet Take 150 mg by mouth daily as needed.    . Sertraline HCl (ZOLOFT PO) Take by mouth.    . hydrocodone-acetaminophen  (LORCET-HD) 5-500 MG per capsule Take 1 capsule by mouth every 4 (four) hours as needed for pain. (Patient not taking: Reported on 10/07/2014) 30 capsule 0   No current facility-administered medications for this visit.    REVIEW OF SYSTEMS:  [X]  denotes positive finding, [ ]  denotes negative finding Cardiac  Comments:  Chest pain or chest pressure:    Shortness of breath upon exertion:    Short of breath when lying flat:    Irregular heart rhythm:        Vascular    Pain in calf, thigh, or hip brought on by ambulation:    Pain in feet at night that wakes you up from your sleep:     Blood clot in your veins:    Leg swelling:         Pulmonary    Oxygen at home:    Productive cough:     Wheezing:         Neurologic    Sudden weakness in arms or legs:     Sudden numbness in arms or legs:     Sudden onset of difficulty speaking or slurred speech:    Temporary loss of vision in one eye:     Problems with dizziness:         Gastrointestinal    Blood in stool:     Vomited blood:         Genitourinary    Burning when urinating:     Blood in urine:        Psychiatric    Major depression:         Hematologic    Bleeding problems:    Problems with blood clotting too easily:        Skin    Rashes or ulcers:        Constitutional    Fever or chills:      PHYSICAL EXAM:     Filed Vitals:   09/30/15 1453 09/30/15 1457  BP: 165/107 153/96  Pulse: 97   Height: 6' 1.5" (1.867 m)   Weight: 195 lb (88.451 kg)   SpO2: 97%     GENERAL: The patient is a well-nourished male, in no acute distress. The vital signs are documented above. VASCULAR: 2+ femoral and dorsalis pedis pulses bilaterally. PULMONARY: Nonlabored respiratory effort. ABDOMEN: Soft and non-tender. MUSCULOSKELETAL: There are no major deformities or cyanosis. NEUROLOGIC: No focal weakness or paresthesias  are detected. SKIN: There are no ulcers or rashes  noted. PSYCHIATRIC: The patient has a normal affect.  DATA:  AAA duplex 09/22/2015  Maximum diameter at mid aorta is 5.0 cm x 5.4 cm.  Previously, 5.0 cm on CT scan 02/11/2015.  MEDICAL ISSUES: Abdominal aortic aneurysm without rupture  The patient's infrarenal abdominal aortic aneurysm has increased in size from 5 cm in October 2016 to 5.4 cm on duplex today. Recommended elective repair. The patient appears to be a good candidate for endovascular stent graft repair. The patient is agreeable to proceed. We will need to obtain a new CTA for preoperative planning. He will also be sent for preoperative cardiac evaluation. The patient wants to complete FMLA forms for his work prior to proceeding with preoperative workup. He will be in touch when he is ready.  Virgina Jock, PA-C [facility] [place]     I have examined the patient, reviewed and agree with above. Long discussion with patient. It 72-year-old with greater than 5 cm aneurysm. Recommended elective repair. He will coordinate things around his job. We will obtain a cardiac evaluation preoperatively and plan stent graft. Will need to repeat thin cut CT angiogram for stent graft planning.  Curt Jews, MD 09/30/2015 4:34 PM      Instructions    After Visit Summary (Printed 09/30/2015)  Communications      [facility] Provider CC Chart Rep sent to Sharilyn Sites, MD  Media   Electronic signature on 09/30/2015 2:48 PM   Orders Placed    Ambulatory referral to Cardiology New Request  Medication Changes     None    Medication List  Visit Diagnoses      AAA (abdominal aortic aneurysm) without rupture [facility])    Pre-operative cardiovascular examination    Problem List  Level of Service   Level of Service  PR OFFICE OUTPATIENT VISIT 15 MINUTES [99213]  All Charges for This Encounter   Code Description Service Date Service Provider  Modifiers Qty  99213 PR OFFICE OUTPATIENT VISIT 4 MINUTES 09/30/2015 [name], MD  1   Addendum:  The patient has been re-examined and re-evaluated.  The patient's history and physical has been reviewed and is unchanged.    Shane Faulkner is a 55 y.o. male is being admitted with Abdominal aortic aneurysm I71.4. All the risks, benefits and other treatment options have been discussed with the patient. The patient has consented to proceed with Procedure(s): ABDOMINAL AORTIC ENDOVASCULAR STENT GRAFT as a surgical intervention.  Curt Jews 01/29/2016 7:07 AM [facility]

## 2016-01-29 NOTE — Transfer of Care (Signed)
Immediate Anesthesia Transfer of Care Note  Patient: Shane Faulkner  Procedure(s) Performed: Procedure(s): ABDOMINAL AORTIC ENDOVASCULAR STENT GRAFT (N/A)  Patient Location: PACU  Anesthesia Type:General  Level of Consciousness: awake, alert , oriented and patient cooperative  Airway & Oxygen Therapy: Patient Spontanous Breathing and Patient connected to nasal cannula oxygen  Post-op Assessment: Report given to RN, Post -op Vital signs reviewed and stable and Patient moving all extremities  Post vital signs: Reviewed and stable  Last Vitals:  Vitals:   01/29/16 0617  BP: (!) 160/99  Pulse: 74  Resp: 20  Temp: 36.8 C    Last Pain:  Vitals:   01/29/16 0617  TempSrc: Oral      Patients Stated Pain Goal: 3 ([id number])  Complications: No apparent anesthesia complications

## 2016-01-29 NOTE — Care Management Note (Signed)
Case Management Note  Patient Details  Name: Shane Faulkner MRN: [id number] Date of Birth: [birth year]  Subjective/Objective:   S/p AAA repair, NCM will cont to follow for dc needs.                  Action/Plan:   Expected Discharge Date:                  Expected Discharge Plan:  Home/Self Care  In-House Referral:     Discharge planning Services  CM Consult  Post Acute Care Choice:    Choice offered to:     DME Arranged:    DME Agency:     HH Arranged:    HH Agency:     Status of Service:  In process, will continue to follow  If discussed at Long Length of Stay Meetings, dates discussed:    Additional Comments:  Zenon Mayo, RN 01/29/2016, 3:18 PM

## 2016-01-29 NOTE — Progress Notes (Signed)
  [facility] Day of Surgery Note  Subjective:  Patient seen in PACU. No complaints.   Vitals:   01/29/16 1130 01/29/16 1200  BP: (!) 146/81 (!) 137/91  Pulse: 63 62  Resp: 16 15  Temp:      Right groin with minimal amount of blood on gauze. No hematoma. Left groin no hematoma.  Palpable DP pulses bilaterally.   Assessment/Plan:  This is a 55 y.o. male who is s/p EVAR  Stable post-op. Groins without hematoma. To 3S when bed available.    Virgina Jock, [place] Pager: [phone] 01/29/2016 12:08 PM

## 2016-01-29 NOTE — Progress Notes (Signed)
  Day of Surgery Note    Subjective:  C/o pain in his left hand from IV  Vitals:   01/29/16 1300 01/29/16 1334  BP: (!) 139/96 (!) 141/96  Pulse: 68 74  Resp: 17 14  Temp: 98.1 F (36.7 C) 97.5 F (36.4 C)    Incisions:   Bilateral groins are soft without hematoma Extremities:  Palpable left DP and palpable right PT; bilateral feet are warm and well perfused Cardiac:  regular Lungs:  Non labored Abdomen:  Soft, NT/ND  Assessment/Plan:  This is a 55 y.o. male who is s/p Gore excluder stent graft repair of abdominal aortic aneurysm  -pt doing well in stepdown with palpable pedal pulses -will remove IV from left hand as it is painful and he has one on the right hand  -anticipate discharge tomorrow   Leontine Locket, PA-C 01/29/2016 3:13 PM [phone]

## 2016-01-29 NOTE — Telephone Encounter (Signed)
-----   Message from [name], MD sent at 01/29/2016 10:00 AM EDT ----- Stent graft repair abdominal aortic aneurysm. 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 He can also have his one-month follow-up CT scan at [facility] since he lives in [place]

## 2016-01-29 NOTE — Op Note (Signed)
    OPERATIVE REPORT  DATE OF SURGERY: 01/29/2016  PATIENT: Shane Faulkner, 55 y.o. male MRN: [id number]  DOB: [birth year]  PRE-OPERATIVE DIAGNOSIS: Abdominal aortic aneurysm  POST-OPERATIVE DIAGNOSIS:  Same  PROCEDURE: [name] excluder stent graft repair of abdominal aortic aneurysm  SURGEON:  Curt Jews, M.D.  PHYSICIAN ASSISTANT: [facility]  ANESTHESIA:  Gen.  EBL: Minimal ml  Total I/O In: 1400 [I.V.:1400] Out: 50 [Blood:50]  BLOOD ADMINISTERED: None none  DRAINS: None  SPECIMEN: None  COUNTS CORRECT:  YES  PLAN OF CARE: PACU   PATIENT DISPOSITION:  PACU - hemodynamically stable  PROCEDURE DETAILS: The patient was taken to room placed supine position where the area of the right and left groins and abdomen were prepped and draped in sterile fashion. Using SonoSite visualization the 18-gauge was the needle was used to access the common femoral arteries bilaterally. Bentson wire was passed centrally and to provide sutures were placed over the guidewire at 10:00 and 12:00 and partially deployed. A French sheath were passed over both of these guidewires. A pigtail catheter was positioned above the level renal arteries and AP projections was undertaken revealing the level of the renal arteries and the level of the hypogastric artery takeoff on the right. A 28 x 14 x 18 cm main body was chosen. Amplatz superstiff wire were exchanged over a guiding catheters bilaterally and a 12 French sheath was passed on the left and an 57 French sheath was passed on the right. The patient was given 6000 units of intravenous heparin. Main body was placed in a crosslegged position and was positioned at the level of the renal arteries on the to the right sheath. Pigtail catheter was repositioned through the left femoral sheath above the level renal arteries. Repeat injection was used to confirm the level of the renal arteries and the main trunk of the main body was deployed. Additional injection  showed excellent positioning. An Amplatz superstiff wire was plastic through the pigtail catheter on the left and a buddy wire technique was used to deploy a Bentson wire into the aneurysm sac. Guiding catheters left sheath was used to cannulate the contralateral gate. This was confirmed by advancing the catheter and twirling this within the main body confirm that this was in the main body. The left 12 French sheath was pulled back to the level of the hypogastric takeoff and hand injection confirmed the level of the hypogastric artery takeoff. The contralateral limb was a 12 x 14 cm limb and this was positioned to land just above the hypogastric artery takeoff. Hand injection to the right femoral sheath revealed the level of the hypogastric takeoff. The main body was completely deployed again landing just above the hypogastric takeoff. A 250 balloon was used to gently dilate the proximal and distal seal zones and also the junction between the main body and the contralateral limb. Pigtail catheter was repositioned above the level of the stent graft. A final injection revealed excellent positioning with good seal and no evidence of endoleak. The sheaths were removed and the Perclose devices were secured giving excellent hemostasis. The wires were removed. The patient was given 50 mg of protamine to reverse the heparin. The small stab incisions in the groin were closed with 4 subcuticular Vicryl sutures. Sterile dressing was applied. The patient had palpable pedal pulses and was transferred to the recovery room in stable condition   [name], M.D., [facility] 01/29/2016 9:55 AM

## 2016-01-30 LAB — BASIC METABOLIC PANEL
Anion gap: 10 (ref 5–15)
BUN: 10 mg/dL (ref 6–20)
CHLORIDE: 104 mmol/L (ref 101–111)
CO2: 21 mmol/L — ABNORMAL LOW (ref 22–32)
CREATININE: 1 mg/dL (ref 0.61–1.24)
Calcium: 9 mg/dL (ref 8.9–10.3)
GFR calc Af Amer: >60 mL/min (ref >60)
GFR calc non Af Amer: >60 mL/min (ref >60)
Glucose, Bld: 100 mg/dL — ABNORMAL HIGH (ref 65–99)
Potassium: 3.9 mmol/L (ref 3.5–5.1)
SODIUM: 135 mmol/L (ref 135–145)

## 2016-01-30 LAB — CBC
HCT: 44.9 % (ref 39.0–52.0)
HEMOGLOBIN: 14.9 g/dL (ref 13.0–17.0)
MCH: 32.5 pg (ref 26.0–34.0)
MCHC: 33.2 g/dL (ref 30.0–36.0)
MCV: 97.8 fL (ref 78.0–100.0)
PLATELETS: 271 10*3/uL (ref 150–400)
RBC: 4.59 MIL/uL (ref 4.22–5.81)
RDW: 15.8 % — ABNORMAL HIGH (ref 11.5–15.5)
WBC: 8.5 10*3/uL (ref 4.0–10.5)

## 2016-01-30 NOTE — Discharge Summary (Signed)
[facility] Discharge Summary   Shane Faulkner 03/04/1961 55 y.o. male  MRN: [id number]  Admission Date: 01/29/2016  Discharge Date: 01/30/16  Physician: [name], MD  Admission Diagnosis: Abdominal aortic aneurysm I71.4   HPI:   This is a 55 y.o. male who presents for follow-up of his abdominal aortic aneurysm. At his last office visit in November 2016, his AAA had a maximum diameter of 5 cm on CT scan. He was offered elective repair versus continued observation. He opted for observation.  He denies any abdominal pain. He has no history of coronary artery disease. He does have has a history of hypertension managed on amlodipine and lisinopril. He takes a daily aspirin. He is a current smoker. He is active.   Hospital Course:  The patient was admitted to the hospital and taken to the operating room on 01/29/2016 and underwent: Gore excluder stent graft repair of abdominal aortic aneurysm    The pt tolerated the procedure well and was transported to the PACU in good condition.   That afternoon, he was complaining of left hand pain from the IV and this was removed.  Otherwise, he had palpable pedal pulses bilaterally.    By POD 1, he was doing well.  He continued to have palpable pulses.  No abdominal pain.   The remainder of the hospital course consisted of increasing mobilization and increasing intake of solids without difficulty.  CBC    Component Value Date/Time   WBC 8.5 01/30/2016 0435   RBC 4.59 01/30/2016 0435   HGB 14.9 01/30/2016 0435   HCT 44.9 01/30/2016 0435   PLT 271 01/30/2016 0435   MCV 97.8 01/30/2016 0435   MCH 32.5 01/30/2016 0435   MCHC 33.2 01/30/2016 0435   RDW 15.8 (H) 01/30/2016 0435    BMET    Component Value Date/Time   NA 135 01/30/2016 0435   K 3.9 01/30/2016 0435   CL 104 01/30/2016 0435   CO2 21 (L) 01/30/2016 0435   GLUCOSE 100 (H) 01/30/2016 0435   BUN 10 01/30/2016 0435   CREATININE 1.00 01/30/2016 0435   CALCIUM 9.0  01/30/2016 0435   GFRNONAA >60 01/30/2016 0435   GFRAA >60 01/30/2016 0435       Discharge Instructions    ABDOMINAL PROCEDURE/ANEURYSM REPAIR/AORTO-BIFEMORAL BYPASS:  Call MD for increased abdominal pain; cramping diarrhea; nausea/vomiting    Complete by:  As directed    Call MD for:  redness, tenderness, or signs of infection (pain, swelling, bleeding, redness, odor or green/yellow discharge around incision site)    Complete by:  As directed    Call MD for:  severe or increased pain, loss or decreased feeling  in affected limb(s)    Complete by:  As directed    Call MD for:  temperature >100.5    Complete by:  As directed    Discharge wound care:    Complete by:  As directed    May remove dressings when you get home. Shower daily and wash wounds with soap and water and pat dry. You may place a band-aid over your incisions. No dressing is necessary however.   Driving Restrictions    Complete by:  As directed    No driving for 1 week   Increase activity slowly    Complete by:  As directed    Walk with assistance use walker or cane as needed   Lifting restrictions    Complete by:  As directed    No lifting for  4 weeks   Resume previous diet    Complete by:  As directed       Discharge Diagnosis:  Abdominal aortic aneurysm I71.4  Secondary Diagnosis: Patient Active Problem List   Diagnosis Date Noted  . Abdominal aortic aneurysm without rupture ([place]) 01/29/2016  . Hyperlipidemia   . Essential hypertension   . AAA (abdominal aortic aneurysm) ([place])   . AAA (abdominal aortic aneurysm) without rupture ([place]) 07/10/2012  . Hemorrhoids 11/01/2011  . Rectal bleeding 11/01/2011   Past Medical History:  Diagnosis Date  . AAA (abdominal aortic aneurysm) ([place])    Followed by Dr. Donnetta Hutching  . Anxiety   . Essential hypertension   . GERD (gastroesophageal reflux disease)   . Headache(784.0)   . Hyperlipidemia   . Left testicular cancer ([place]) 06/2012   a. s/p L orchiectomy  .  Needle phobia        Medication List    TAKE these medications   amLODipine-olmesartan 10-40 MG tablet Commonly known as:  AZOR Take 1 tablet by mouth daily.   aspirin 81 MG EC tablet Take 81 mg by mouth every morning. Swallow whole.   atorvastatin 10 MG tablet Commonly known as:  LIPITOR Take 1 tablet (10 mg total) by mouth daily.   carvedilol 6.25 MG tablet Commonly known as:  COREG Take 2 tablets (12.5 mg total) by mouth 2 (two) times daily with a meal.   hydrALAZINE 25 MG tablet Commonly known as:  APRESOLINE Take 1 tablet (25 mg total) by mouth 2 (two) times daily.   omeprazole 20 MG capsule Commonly known as:  PRILOSEC Take 20 mg by mouth daily as needed (indigestion).   oxyCODONE-acetaminophen 5-325 MG tablet Commonly known as:  PERCOCET/ROXICET Take 1 tablet by mouth every 6 (six) hours as needed.   pantoprazole 40 MG tablet Commonly known as:  PROTONIX Take 40 mg by mouth daily.   sertraline 25 MG tablet Commonly known as:  ZOLOFT Take 25 mg by mouth at bedtime as needed (for mood).       Prescriptions given: Percocet #8 No Refill  Instructions: 1.  No driving x 2 weeks. 2.  No heavy lifting x 2 weeks. 3.  Shower daily with soap and water starting 01/31/16  Disposition: home  Patient's condition: is Good  Follow up: 1. Dr. Donnetta Hutching in 4 weeks with CTA   Leontine Locket, PA-C [facility] [phone] 01/30/2016  7:11 AM   - For VQI Registry use --- Instructions: Press F2 to tab through selections.  Delete question if not applicable.   Post-op:  Time to Extubation: [x]  In OR, [ ]  < 12 hrs, [ ]  12-24 hrs, [ ]  >=24 hrs Vasopressors Req. Post-op: No MI: No., [ ]  Troponin only, [ ]  EKG or Clinical New Arrhythmia: No CHF: No ICU Stay: 1 day in stepdown Transfusion: No  If yes, n/a units given  Complications: Resp failure: No., [ ]  Pneumonia, [ ]  Ventilator Chg in renal function: No., [ ]  Inc. Cr > 0.5, [ ]  Temp.  Dialysis, [ ]  Permanent dialysis Leg ischemia: No., no Surgery needed, [ ]  Yes, Surgery needed, [ ]  Amputation Bowel ischemia: No., [ ]  Medical Rx, [ ]  Surgical Rx Wound complication: No., [ ]  Superficial separation/infection, [ ]  Return to OR Return to OR: No  Return to OR for bleeding: No Stroke: No., [ ]  Minor, [ ]  Major  Discharge medications: Statin use:  Yes If No: [ ]  For Medical reasons, [ ]   Non-compliant, [ ]  Not-indicated ASA use:  Yes  If No: [ ]  For Medical reasons, [ ]  Non-compliant, [ ]  Not-indicated Plavix use:  No If No: [ ]  For Medical reasons, [ ]  Non-compliant, [ ]  Not-indicated Beta blocker use:  Yes If No: [ ]  For Medical reasons, [ ]  Non-compliant, [ ]  Not-indicated

## 2016-01-30 NOTE — Progress Notes (Signed)
Discharge note. Discharge instructions reviewed with patient and patient's wife at the bedside. Reviewed when to call MD, signs and symptoms of infections, care of the surgical site, all medications, and importance of follow up appointments. Pt stated that he feels comfortable with the information and is ready to go home. Pt is ready for discharge.

## 2016-01-30 NOTE — Progress Notes (Addendum)
  Progress Note    01/30/2016 7:08 AM 1 Day Post-Op  Subjective:  Ready to go home.  Wants IV out  Afebrile HR  60's-70's NSR [id number] systolic [id number] RA  Vitals:   01/30/16 0414 01/30/16 0510  BP: (!) 173/98 (!) 147/97  Pulse: 77 70  Resp: 19 12  Temp: 98.3 F (36.8 C)     Physical Exam: Cardiac:  regluar Lungs:  Non labored Incisions:  Bilateral groins are soft without hematoma. Extremities:  + palpable PT pulses bilaterally   CBC    Component Value Date/Time   WBC 8.5 01/30/2016 0435   RBC 4.59 01/30/2016 0435   HGB 14.9 01/30/2016 0435   HCT 44.9 01/30/2016 0435   PLT 271 01/30/2016 0435   MCV 97.8 01/30/2016 0435   MCH 32.5 01/30/2016 0435   MCHC 33.2 01/30/2016 0435   RDW 15.8 (H) 01/30/2016 0435    BMET    Component Value Date/Time   NA 135 01/30/2016 0435   K 3.9 01/30/2016 0435   CL 104 01/30/2016 0435   CO2 21 (L) 01/30/2016 0435   GLUCOSE 100 (H) 01/30/2016 0435   BUN 10 01/30/2016 0435   CREATININE 1.00 01/30/2016 0435   CALCIUM 9.0 01/30/2016 0435   GFRNONAA >60 01/30/2016 0435   GFRAA >60 01/30/2016 0435    INR    Component Value Date/Time   INR 1.04 01/29/2016 0950     Intake/Output Summary (Last 24 hours) at 01/30/16 0708 Last data filed at 01/30/16 0500  Gross per 24 hour  Intake             2760 ml  Output             2100 ml  Net              660 ml     Assessment:  55 y.o. male is s/p:  Gore excluder stent graft repair of abdominal aortic aneurysm   1 Day Post-Op  Plan: -pt doing well this am with palpable PT pulses bilaterally -pt has ambulated and voided.  Pain is well controlled -dc home later this morning -f/u with Dr. Donnetta Hutching in 4 weeks with CTA   Leontine Locket, PA-C [facility] [phone] 01/30/2016 7:08 AM   Addendum  I have independently interviewed and examined the patient, and I agree with the physician assistant's findings.  No abd pain.  Groin puncture site are c/d/i.   Palpable PT B.  Tolerating PO and ambulation.  Ok to D/C.    Adele Barthel, MD, FACS [facility] [place] Office: [phone] Pager: [phone]  01/30/2016, 11:38 AM

## 2016-01-30 NOTE — Progress Notes (Signed)
Patient ambulated 300 feet on RA with no assistive devices.  Patient denied pain or discomfort.  VSS. Patient now sitting in chair awaiting doctor.

## 2016-01-31 ENCOUNTER — Encounter (HOSPITAL_COMMUNITY): Payer: Self-pay | Admitting: Vascular Surgery

## 2016-02-01 ENCOUNTER — Encounter (HOSPITAL_COMMUNITY): Payer: Self-pay | Admitting: Vascular Surgery

## 2016-02-02 ENCOUNTER — Telehealth: Payer: Self-pay | Admitting: Vascular Surgery

## 2016-02-02 NOTE — Telephone Encounter (Signed)
spoke to pt on home#, will mail letter w/instructions as well for CTA on 11/9 and f/u with Doc on 11/14

## 2016-02-02 NOTE — Telephone Encounter (Signed)
-----   Message from Mena Goes, RN sent at 01/29/2016 12:15 PM EDT ----- Regarding: May be a duplicate   ----- Message ----- From: Alvia Grove, PA-C Sent: 01/29/2016   9:36 AM To: Vvs Charge Pool  S/p EVAR 01/29/16  F/u with Dr. Donnetta Hutching in 4 weeks with CTA  Thanks Maudie Mercury

## 2016-02-06 NOTE — Anesthesia Postprocedure Evaluation (Signed)
Anesthesia Post Note  Patient: Shane Faulkner  Procedure(s) Performed: Procedure(s) (LRB): ABDOMINAL AORTIC ENDOVASCULAR STENT GRAFT (N/A)  Patient location during evaluation: PACU Anesthesia Type: General Level of consciousness: awake Pain management: pain level controlled Vital Signs Assessment: post-procedure vital signs reviewed and stable Respiratory status: spontaneous breathing Cardiovascular status: stable Postop Assessment: no signs of nausea or vomiting Anesthetic complications: no    Last Vitals:  Vitals:   01/30/16 0510 01/30/16 0710  BP: (!) 147/97 (!) 154/91  Pulse: 70 81  Resp: 12 13  Temp:  36.9 C    Last Pain:  Vitals:   01/30/16 0710  TempSrc: Oral  PainSc:                  Tynesha Free

## 2016-02-10 ENCOUNTER — Other Ambulatory Visit: Payer: BLUE CROSS/BLUE SHIELD

## 2016-02-17 ENCOUNTER — Ambulatory Visit: Payer: BLUE CROSS/BLUE SHIELD | Attending: Adult Health | Admitting: Neurology

## 2016-02-17 DIAGNOSIS — G4761 Periodic limb movement disorder: Secondary | ICD-10-CM | POA: Diagnosis not present

## 2016-02-17 DIAGNOSIS — Z7982 Long term (current) use of aspirin: Secondary | ICD-10-CM | POA: Diagnosis not present

## 2016-02-17 DIAGNOSIS — I10 Essential (primary) hypertension: Secondary | ICD-10-CM | POA: Diagnosis present

## 2016-02-17 DIAGNOSIS — Z79899 Other long term (current) drug therapy: Secondary | ICD-10-CM | POA: Diagnosis not present

## 2016-02-17 DIAGNOSIS — G4733 Obstructive sleep apnea (adult) (pediatric): Secondary | ICD-10-CM | POA: Insufficient documentation

## 2016-02-20 NOTE — Procedures (Signed)
    [place] A. Merlene Laughter, MD     [email or web address]             NOCTURNAL POLYSOMNOGRAPHY   LOCATION: [facility]   Patient Name: Shane Faulkner, Shane Faulkner Date: 02/17/2016 Gender: Male D.O.B: May 24, 1960 Age (years): 58 Referring Provider: Jory Sims NP Height (inches): 72 Interpreting Physician: Phillips Odor MD, ABSM Weight (lbs): 192 RPSGT: Peak, Robert BMI: 26 MRN: [id number] Neck Size: 17.50 CLINICAL INFORMATION Sleep Study Type: NPSG Indication for sleep study: N/A Epworth Sleepiness Score: 5 SLEEP STUDY TECHNIQUE As per the AASM Manual for the Scoring of Sleep and Associated Events v2.3 (April 2016) with a hypopnea requiring 4% desaturations. The channels recorded and monitored were frontal, central and occipital EEG, electrooculogram (EOG), submentalis EMG (chin), nasal and oral airflow, thoracic and abdominal wall motion, anterior tibialis EMG, snore microphone, electrocardiogram, and pulse oximetry. MEDICATIONS Medications self-administered by patient taken the night of the study : N/A  Current Outpatient Prescriptions:  .  amLODipine-olmesartan (AZOR) 10-40 MG tablet, Take 1 tablet by mouth daily., Disp: 30 tablet, Rfl: 11 .  aspirin 81 MG EC tablet, Take 81 mg by mouth every morning. Swallow whole., Disp: , Rfl:  .  atorvastatin (LIPITOR) 10 MG tablet, Take 1 tablet (10 mg total) by mouth daily., Disp: 30 tablet, Rfl: 11 .  carvedilol (COREG) 6.25 MG tablet, Take 2 tablets (12.5 mg total) by mouth 2 (two) times daily with a meal., Disp: 60 tablet, Rfl: 3 .  hydrALAZINE (APRESOLINE) 25 MG tablet, Take 1 tablet (25 mg total) by mouth 2 (two) times daily., Disp: 180 tablet, Rfl: 3 .  omeprazole (PRILOSEC) 20 MG capsule, Take 20 mg by mouth daily as needed (indigestion)., Disp: , Rfl:  .  oxyCODONE-acetaminophen (PERCOCET/ROXICET) 5-325 MG tablet, Take 1 tablet by mouth every 6 (six) hours as needed., Disp: 8 tablet, Rfl: 0 .  pantoprazole  (PROTONIX) 40 MG tablet, Take 40 mg by mouth daily., Disp: , Rfl:  .  sertraline (ZOLOFT) 25 MG tablet, Take 25 mg by mouth at bedtime as needed (for mood)., Disp: , Rfl:   SLEEP ARCHITECTURE The study was initiated at 10:23:05 PM and ended at 5:00:12 AM. Sleep onset time was 55.4 minutes and the sleep efficiency was 72.6%. The total sleep time was 288.2 minutes. Stage REM latency was 100.5 minutes. The patient spent 23.77% of the night in stage N1 sleep, 59.58% in stage N2 sleep, 0.00% in stage N3 and 16.65% in REM. Alpha intrusion was absent. Supine sleep was 12.84%. RESPIRATORY PARAMETERS The overall apnea/hypopnea index (AHI) was 5.8 per hour. There were 11 total apneas, including 11 obstructive, 0 central and 0 mixed apneas. There were 17 hypopneas and 147 RERAs. The AHI during Stage REM sleep was 2.5 per hour. AHI while supine was 17.8 per hour. The mean oxygen saturation was 93.44%. The minimum SpO2 during sleep was 90.00%. Moderate snoring was noted during this study. CARDIAC DATA The 2 lead EKG demonstrated sinus rhythm. The mean heart rate was 63.21 beats per minute. Other EKG findings include: None. LEG MOVEMENT DATA The total PLMS were 36 with a resulting PLMS index of 7.49. Associated arousal with leg movement index was 0.0.   IMPRESSIONS - This study reveals mild obstructive sleep apnea not requiring positive pressure treatment. - Mild periodic limb movements of sleep occurred during the study. No significant associated arousals.  Delano Metz, MD Diplomate, American Board of Sleep Medicine.

## 2016-02-23 ENCOUNTER — Ambulatory Visit: Payer: BLUE CROSS/BLUE SHIELD | Admitting: Vascular Surgery

## 2016-03-04 ENCOUNTER — Encounter: Payer: Self-pay | Admitting: Vascular Surgery

## 2016-03-10 ENCOUNTER — Other Ambulatory Visit: Payer: BLUE CROSS/BLUE SHIELD

## 2016-03-10 ENCOUNTER — Ambulatory Visit: Payer: BLUE CROSS/BLUE SHIELD | Admitting: Adult Health

## 2016-03-10 ENCOUNTER — Encounter: Payer: Self-pay | Admitting: Adult Health

## 2016-03-10 NOTE — Progress Notes (Deleted)
Cardiology Office Note   Date:  03/10/2016   ID:  Shane Faulkner, DOB 02/06/1961, MRN [id number]  PCP:  Purvis Kilts, MD  Cardiologist: [place]/  Jory Sims, NP   No chief complaint on file.     History of Present Illness: Shane Faulkner is a 55 y.o. male who presents for ongoing assessment and management of hypertension, history of AAA, anxiety, GERD, and test testicular cancer. The patient had a AAA repair and was found to have hypertensive urgency during that hospitalization. He was started on carvedilol twice a day. The patient was scheduled for a sleep study after having had surgery due to difficult to control hypertension. He had a successful aortic aneurysm repair on 01/29/2016. Sleep study was completed on 02/17/2016.    Past Medical History:  Diagnosis Date  . AAA (abdominal aortic aneurysm) ([place])    Followed by Dr. Donnetta Hutching  . Anxiety   . Essential hypertension   . GERD (gastroesophageal reflux disease)   . Headache(784.0)   . Hyperlipidemia   . Left testicular cancer ([place]) 06/2012   a. s/p L orchiectomy  . Needle phobia     Past Surgical History:  Procedure Laterality Date  . ABDOMINAL AORTIC ENDOVASCULAR STENT GRAFT N/A 01/29/2016   Procedure: ABDOMINAL AORTIC ENDOVASCULAR STENT GRAFT;  Surgeon: [name], MD;  Location: [facility];  Service: Vascular;  Laterality: N/A;  . ORCHIECTOMY  06/05/2012   Procedure: ORCHIECTOMY;  Surgeon: Franchot Gallo, MD;  Location: [facility];  Service: Urology;  Laterality: Left;  Left Inguinal Orchiectomy  . TONSILLECTOMY  1968     Current Outpatient Prescriptions  Medication Sig Dispense Refill  . amLODipine-olmesartan (AZOR) 10-40 MG tablet Take 1 tablet by mouth daily. 30 tablet 11  . aspirin 81 MG EC tablet Take 81 mg by mouth every morning. Swallow whole.    Marland Kitchen atorvastatin (LIPITOR) 10 MG tablet Take 1 tablet (10 mg total) by mouth daily. 30 tablet 11  . carvedilol (COREG) 6.25 MG tablet Take 2 tablets (12.5 mg  total) by mouth 2 (two) times daily with a meal. 60 tablet 3  . hydrALAZINE (APRESOLINE) 25 MG tablet Take 1 tablet (25 mg total) by mouth 2 (two) times daily. 180 tablet 3  . omeprazole (PRILOSEC) 20 MG capsule Take 20 mg by mouth daily as needed (indigestion).    Marland Kitchen oxyCODONE-acetaminophen (PERCOCET/ROXICET) 5-325 MG tablet Take 1 tablet by mouth every 6 (six) hours as needed. 8 tablet 0  . pantoprazole (PROTONIX) 40 MG tablet Take 40 mg by mouth daily.    . sertraline (ZOLOFT) 25 MG tablet Take 25 mg by mouth at bedtime as needed (for mood).     No current facility-administered medications for this visit.     Allergies:   Other and Penicillins    Social History:  The patient  reports that he has been smoking Cigarettes.  He started smoking about 37 years ago. He has a 48.00 pack-year smoking history. He has never used smokeless tobacco. He reports that he drinks about 14.4 oz of alcohol per week . He reports that he does not use drugs.   Family History:  The patient's family history includes AAA (abdominal aortic aneurysm) in his father; Hyperlipidemia in his father.    ROS: All other systems are reviewed and negative. Unless otherwise mentioned in H&P    PHYSICAL EXAM: VS:  There were no vitals taken for this visit. , BMI There is no height or weight on file to calculate  BMI. GEN: Well nourished, well developed, in no acute distress HEENT: normal Neck: no JVD, carotid bruits, or masses Cardiac: ***RRR; no murmurs, rubs, or gallops,no edema  Respiratory:  clear to auscultation bilaterally, normal work of breathing GI: soft, nontender, nondistended, + BS MS: no deformity or atrophy Skin: warm and dry, no rash Neuro:  Strength and sensation are intact Psych: euthymic mood, full affect   EKG:  EKG {ACTION; IS/IS [id number] ordered today. The ekg ordered today demonstrates ***   Recent Labs: 01/28/2016: ALT 33 01/29/2016: Magnesium 1.9 01/30/2016: BUN 10; Creatinine, Ser 1.00;  Hemoglobin 14.9; Platelets 271; Potassium 3.9; Sodium 135    Lipid Panel No results found for: CHOL, TRIG, HDL, CHOLHDL, VLDL, LDLCALC, LDLDIRECT    Wt Readings from Last 3 Encounters:  01/29/16 196 lb 3.4 oz (89 kg)  01/26/16 194 lb 4.8 oz (88.1 kg)  01/26/16 193 lb 12.8 oz (87.9 kg)      Other studies Reviewed: Additional studies/ records that were reviewed today include: ***. Review of the above records demonstrates: ***   ASSESSMENT AND PLAN:  1.  ***   Current medicines are reviewed at length with the patient today.    Labs/ tests ordered today include: *** No orders of the defined types were placed in this encounter.    Disposition:   FU with *** in {gen number [id number] {TIME; UNITS DAY/WEEK/MONTH:19136}   Signed, Jory Sims, NP  03/10/2016 7:31 AM    [facility] [address], [place],  [address] Phone: [phone]; Fax: [phone]

## 2016-03-15 ENCOUNTER — Ambulatory Visit: Payer: BLUE CROSS/BLUE SHIELD | Admitting: Vascular Surgery

## 2016-03-16 ENCOUNTER — Encounter: Payer: Self-pay | Admitting: Vascular Surgery

## 2016-03-16 ENCOUNTER — Ambulatory Visit: Payer: BLUE CROSS/BLUE SHIELD | Admitting: Family

## 2016-03-16 ENCOUNTER — Encounter (HOSPITAL_COMMUNITY): Payer: BLUE CROSS/BLUE SHIELD

## 2016-03-21 ENCOUNTER — Telehealth: Payer: Self-pay | Admitting: Vascular Surgery

## 2016-03-21 NOTE — Telephone Encounter (Signed)
-----   Message from Mena Goes, RN sent at 03/20/2016  8:09 PM EST ----- Regarding: RE: Note for work Contact: [phone] He will have to wait until his appt on 03-29-16 to get a note like that. His CTA is on the 27th. It will have to be written at that time.  ----- Message ----- From: Lujean Amel Sent: 03/17/2016   2:35 PM To: Mena Goes, RN Subject: RE: Note for work                              He stated he needed specifics about how much weight he can lift at work. Other than that he wasn't very clear to me about the dates/time frame he needs for his work note. ----- Message ----- From: Mena Goes, RN Sent: 03/17/2016   2:25 PM To: Lujean Amel Subject: RE: Note for work                              Does he need a letter to cover him those 2 days?  ----- Message ----- From: Lujean Amel Sent: 03/17/2016   1:04 PM To: Mena Goes, RN Subject: Note for work                                  Dear Zigmund Daniel, I finally spoke w/ [facility] and was able to get his CTA rescheduled for 03/28/16 at 4pm and to see Dr.Early on 03/29/16 at 3pm.  I am gonna mail him a note with all this information as well. He did ask questions about a work note and Colletta Maryland said you were working on this for him. I told him we would be in contact with him later today about the work note/excuse. Thanks, Anne Ng

## 2016-03-21 NOTE — Telephone Encounter (Signed)
I called patient and relayed the message from the nurse. The patient voiced understanding and will ask Dr.Early about the note for work on 03/29/16 at his appt. awt

## 2016-03-22 ENCOUNTER — Ambulatory Visit: Payer: BLUE CROSS/BLUE SHIELD | Admitting: Vascular Surgery

## 2016-03-28 ENCOUNTER — Encounter: Payer: Self-pay | Admitting: Vascular Surgery

## 2016-03-28 ENCOUNTER — Ambulatory Visit
Admission: RE | Admit: 2016-03-28 | Discharge: 2016-03-28 | Disposition: A | Discharge status: Home / Self Care | Payer: BLUE CROSS/BLUE SHIELD | Source: Ambulatory Visit | Attending: Vascular Surgery | Admitting: Vascular Surgery

## 2016-03-28 DIAGNOSIS — I714 Abdominal aortic aneurysm, without rupture, unspecified: Secondary | ICD-10-CM

## 2016-03-28 DIAGNOSIS — Z48812 Encounter for surgical aftercare following surgery on the circulatory system: Secondary | ICD-10-CM

## 2016-03-29 ENCOUNTER — Encounter: Payer: Self-pay | Admitting: Vascular Surgery

## 2016-03-29 ENCOUNTER — Ambulatory Visit (INDEPENDENT_AMBULATORY_CARE_PROVIDER_SITE_OTHER): Payer: Self-pay | Admitting: Vascular Surgery

## 2016-03-29 ENCOUNTER — Encounter: Payer: Self-pay | Admitting: *Deleted

## 2016-03-29 VITALS — BP 136/82 | HR 80 | Temp 97.0°F | Resp 18 | Ht 73.5 in | Wt 201.0 lb

## 2016-03-29 DIAGNOSIS — I714 Abdominal aortic aneurysm, without rupture, unspecified: Secondary | ICD-10-CM

## 2016-03-29 NOTE — Progress Notes (Signed)
   Patient name: PRESCOTT MCCONNELL MRN: [id number] DOB: 1960-12-24 Sex: male  REASON FOR VISIT: Follow-up stent graft repair of abdominal aortic aneurysm  HPI: DAL FLAUGH is a 55 y.o. male today for follow-up after undergoing a stent graft repair of abdominal aortic aneurysm on 01/29/2016. He was scheduled to have a CT angiogram follow-up visit with me today. He has a extreme phobia with needles. Apparently had several different attempts at placing bilateral antecubital IVs yesterday which were unsuccessful. He was unable to continue attempts.  Current Outpatient Prescriptions  Medication Sig Dispense Refill  . amLODipine-olmesartan (AZOR) 10-40 MG tablet Take 1 tablet by mouth daily. 30 tablet 11  . aspirin 81 MG EC tablet Take 81 mg by mouth every morning. Swallow whole.    Marland Kitchen atorvastatin (LIPITOR) 10 MG tablet Take 1 tablet (10 mg total) by mouth daily. 30 tablet 11  . carvedilol (COREG) 6.25 MG tablet Take 2 tablets (12.5 mg total) by mouth 2 (two) times daily with a meal. 60 tablet 3  . hydrALAZINE (APRESOLINE) 25 MG tablet Take 1 tablet (25 mg total) by mouth 2 (two) times daily. 180 tablet 3  . omeprazole (PRILOSEC) 20 MG capsule Take 20 mg by mouth daily as needed (indigestion).    Marland Kitchen oxyCODONE-acetaminophen (PERCOCET/ROXICET) 5-325 MG tablet Take 1 tablet by mouth every 6 (six) hours as needed. 8 tablet 0  . pantoprazole (PROTONIX) 40 MG tablet Take 40 mg by mouth daily.    . sertraline (ZOLOFT) 25 MG tablet Take 25 mg by mouth at bedtime as needed (for mood).     No current facility-administered medications for this visit.      PHYSICAL EXAM: Vitals:   03/29/16 1502  BP: 136/82  Pulse: 80  Resp: 18  Temp: 97 F (36.1 C)  TempSrc: Oral  SpO2: 99%  Weight: 201 lb (91.2 kg)  Height: 6' 1.5" (1.867 m)    GENERAL: The patient is a well-nourished male, in no acute distress. The vital signs are documented above. Both groin puncture sites  are healing quite nicely and he has 2+ popliteal pulses. Abdomen soft with no pulsatile mass MEDICAL ISSUES:  stable overall after stent graft repair. Explain the reason for needing a contrast CT scan as initial follow-up after stent graft replacement. He is willing to give it another try regarding his IV. His last CT angiogram was at [facility]. He feels more comfortable there and reports that there was a IV technician was particularly patient with him. We will attempt to reschedule this there with this individual. I will follow-up with him by telephone after this to let him know the results. Assuming that there is no concern we will see him in 6 months with ultrasound follow-up   [name], MD [facility] Tel [phone] Pager [phone]

## 2016-03-30 ENCOUNTER — Other Ambulatory Visit: Payer: Self-pay

## 2016-03-30 NOTE — Addendum Note (Signed)
Addended by: Lianne Cure A on: 03/30/2016 08:24 AM   Modules accepted: Orders

## 2016-03-31 ENCOUNTER — Ambulatory Visit (HOSPITAL_COMMUNITY)
Admission: RE | Admit: 2016-03-31 | Discharge: 2016-03-31 | Disposition: A | Discharge status: Home / Self Care | Payer: BLUE CROSS/BLUE SHIELD | Source: Ambulatory Visit | Attending: Vascular Surgery | Admitting: Vascular Surgery

## 2016-03-31 DIAGNOSIS — I714 Abdominal aortic aneurysm, without rupture, unspecified: Secondary | ICD-10-CM

## 2016-03-31 DIAGNOSIS — N261 Atrophy of kidney (terminal): Secondary | ICD-10-CM | POA: Diagnosis not present

## 2016-03-31 DIAGNOSIS — K5730 Diverticulosis of large intestine without perforation or abscess without bleeding: Secondary | ICD-10-CM | POA: Insufficient documentation

## 2016-03-31 MED ORDER — IOPAMIDOL (ISOVUE-370) INJECTION 76%
100.0000 mL | Freq: Once | INTRAVENOUS | Status: AC | PRN
  Administered 2016-03-31: 100 mL via INTRAVENOUS

## 2016-04-13 ENCOUNTER — Telehealth: Payer: Self-pay

## 2016-04-13 ENCOUNTER — Other Ambulatory Visit: Payer: Self-pay

## 2016-04-13 DIAGNOSIS — Z95828 Presence of other vascular implants and grafts: Secondary | ICD-10-CM

## 2016-04-13 NOTE — Telephone Encounter (Signed)
Per Dr. Luther Parody request, notified Shane Faulkner that CT results look good post EVAR. Also, informed patient that he will be notified by our scheduler of ultrasound and office visit with Shane Faulkner for 6 month follow-up. Patient verbalized understanding and was thankful for receiving results.

## 2016-04-14 ENCOUNTER — Other Ambulatory Visit: Payer: Self-pay | Admitting: Physician Assistant

## 2016-04-19 ENCOUNTER — Telehealth: Payer: Self-pay | Admitting: Cardiovascular Disease

## 2016-04-19 NOTE — Telephone Encounter (Signed)
Called patient to schedule / no answer or no voicemail 12/19/tg

## 2016-04-19 NOTE — Telephone Encounter (Signed)
-----   Message from Bernita Raisin, RN sent at 04/15/2016  9:19 AM EST ----- Regarding: needs md fu Wants refills ,was due to come back in oct with sm

## 2016-06-03 ENCOUNTER — Other Ambulatory Visit: Payer: Self-pay | Admitting: Cardiology

## 2016-06-03 ENCOUNTER — Other Ambulatory Visit: Payer: Self-pay

## 2016-06-03 MED ORDER — CARVEDILOL 12.5 MG PO TABS: 12.5000 mg | ORAL_TABLET | Freq: Two times a day (BID) | ORAL | 1 refills | Status: DC

## 2016-06-03 NOTE — Telephone Encounter (Signed)
Coreg 12.5 mg bid escribed to [employer]

## 2016-11-01 ENCOUNTER — Other Ambulatory Visit (HOSPITAL_COMMUNITY): Payer: BLUE CROSS/BLUE SHIELD

## 2016-11-01 ENCOUNTER — Ambulatory Visit: Payer: BLUE CROSS/BLUE SHIELD | Admitting: Vascular Surgery

## 2016-11-21 ENCOUNTER — Encounter: Payer: Self-pay | Admitting: Vascular Surgery

## 2016-12-06 ENCOUNTER — Ambulatory Visit: Payer: 59 | Admitting: Vascular Surgery

## 2016-12-06 ENCOUNTER — Ambulatory Visit (HOSPITAL_COMMUNITY): Payer: 59 | Attending: Vascular Surgery

## 2016-12-13 ENCOUNTER — Encounter: Payer: Self-pay | Admitting: Vascular Surgery

## 2016-12-13 ENCOUNTER — Ambulatory Visit (INDEPENDENT_AMBULATORY_CARE_PROVIDER_SITE_OTHER): Payer: 59 | Admitting: Vascular Surgery

## 2016-12-13 ENCOUNTER — Ambulatory Visit (HOSPITAL_COMMUNITY)
Admission: RE | Admit: 2016-12-13 | Discharge: 2016-12-13 | Disposition: A | Discharge status: Home / Self Care | Payer: 59 | Source: Ambulatory Visit | Attending: Vascular Surgery | Admitting: Vascular Surgery

## 2016-12-13 VITALS — BP 147/90 | HR 69 | Temp 98.1°F | Resp 16 | Ht 74.5 in | Wt 196.6 lb

## 2016-12-13 DIAGNOSIS — Z95828 Presence of other vascular implants and grafts: Secondary | ICD-10-CM | POA: Insufficient documentation

## 2016-12-13 DIAGNOSIS — I714 Abdominal aortic aneurysm, without rupture, unspecified: Secondary | ICD-10-CM

## 2016-12-13 DIAGNOSIS — Z48812 Encounter for surgical aftercare following surgery on the circulatory system: Secondary | ICD-10-CM | POA: Diagnosis present

## 2016-12-13 NOTE — Progress Notes (Signed)
[facility]  Patient name: Shane Faulkner MRN: [id number] DOB: 03-31-1961 Sex: male  REASON FOR VISIT: Follow-up of stent graft repair of abdominal aortic aneurysm  HPI: Shane Faulkner is a 56 y.o. male here today for follow-up of his stent graft repair abdominal aortic aneurysm on 01/29/2016. He continues to be in the good health with no new medical difficulties. No symptoms referable to his aneurysm and no evidence of lower extremity arterial insufficiency  Past Medical History:  Diagnosis Date  . AAA (abdominal aortic aneurysm) ([place])    Followed by Dr. Donnetta Hutching  . Anxiety   . Essential hypertension   . GERD (gastroesophageal reflux disease)   . Headache(784.0)   . Hyperlipidemia   . Left testicular cancer ([place]) 06/2012   a. s/p L orchiectomy  . Needle phobia     Family History  Problem Relation Age of Onset  . AAA (abdominal aortic aneurysm) Father   . Hyperlipidemia Father     SOCIAL HISTORY: Social History  Substance Use Topics  . Smoking status: Current Every Day Smoker    Packs/day: 1.50    Years: 32.00    Types: Cigarettes    Start date: 12/08/1978  . Smokeless tobacco: Never Used  . Alcohol use 14.4 oz/week    24 Cans of beer per week    Allergies  Allergen Reactions  . Other Anaphylaxis    Cashews  . Penicillins Anaphylaxis    Has patient had a PCN reaction causing immediate rash, facial/tongue/throat swelling, SOB or lightheadedness with hypotension: Yes Has patient had a PCN reaction causing severe rash involving mucus membranes or skin necrosis: No Has patient had a PCN reaction that required hospitalization No Has patient had a PCN reaction occurring within the last 10 years: No If all of the above answers are "NO", then may proceed with Cephalosporin use.     Current Outpatient Prescriptions  Medication Sig Dispense Refill  . amLODipine-olmesartan (AZOR) 10-40 MG tablet Take 1  tablet by mouth daily. 30 tablet 11  . aspirin 81 MG EC tablet Take 81 mg by mouth every morning. Swallow whole.    . carvedilol (COREG) 12.5 MG tablet Take 1 tablet (12.5 mg total) by mouth 2 (two) times daily. 180 tablet 1  . hydrALAZINE (APRESOLINE) 25 MG tablet Take 1 tablet (25 mg total) by mouth 2 (two) times daily. 180 tablet 3  . pantoprazole (PROTONIX) 40 MG tablet Take 40 mg by mouth daily.    . sertraline (ZOLOFT) 25 MG tablet Take 25 mg by mouth at bedtime as needed (for mood).     No current facility-administered medications for this visit.     REVIEW OF SYSTEMS:  [X]  denotes positive finding, [ ]  denotes negative finding Cardiac  Comments:  Chest pain or chest pressure:    Shortness of breath upon exertion:    Short of breath when lying flat:    Irregular heart rhythm:        Vascular    Pain in calf, thigh, or hip brought on by ambulation:    Pain in feet at night that wakes you up from your sleep:     Blood clot in your veins:    Leg swelling:           PHYSICAL EXAM: Vitals:   12/13/16 0946  BP: (!) 147/90  Pulse: 69  Resp: 16  Temp: 98.1 F (36.7 C)  TempSrc: Oral  SpO2: 97%  Weight: 196  lb 9.6 oz (89.2 kg)  Height: 6' 2.5" (1.892 m)    GENERAL: The patient is a well-nourished male, in no acute distress. The vital signs are documented above. CARDIOVASCULAR: 2+ radial and 2+ posterior tibial pulses. No palpable aneurysm in his aorta PULMONARY: There is good air exchange  MUSCULOSKELETAL: There are no major deformities or cyanosis. NEUROLOGIC: No focal weakness or paresthesias are detected. SKIN: There are no ulcers or rashes noted. PSYCHIATRIC: The patient has a normal affect.  DATA:  Duplex today shows a slight decrease in aneurysm sac size compared to study from May 2017. Maximal diameter currently is 5.2 cm.  MEDICAL ISSUES: Stable follow-up of stent graft repair. He will continue full activity without limitation. We will see him again in one  year with repeat duplex ultrasound of his aorta to rule out any expansion in size    [name], MD [facility] Tel [phone] Pager [phone]

## 2016-12-15 NOTE — Addendum Note (Signed)
Addended by: Lianne Cure A on: 12/15/2016 08:56 AM   Modules accepted: Orders

## 2016-12-16 ENCOUNTER — Other Ambulatory Visit: Payer: Self-pay | Admitting: Cardiology

## 2016-12-29 ENCOUNTER — Other Ambulatory Visit: Payer: Self-pay | Admitting: Physician Assistant

## 2017-01-26 ENCOUNTER — Other Ambulatory Visit: Payer: Self-pay | Admitting: Physician Assistant

## 2017-01-26 ENCOUNTER — Other Ambulatory Visit: Payer: Self-pay | Admitting: Adult Health

## 2017-02-01 ENCOUNTER — Other Ambulatory Visit: Payer: Self-pay | Admitting: Adult Health

## 2017-03-08 ENCOUNTER — Other Ambulatory Visit: Payer: Self-pay | Admitting: Cardiology

## 2017-03-08 ENCOUNTER — Other Ambulatory Visit: Payer: Self-pay | Admitting: Adult Health

## 2017-07-11 DIAGNOSIS — E782 Mixed hyperlipidemia: Secondary | ICD-10-CM | POA: Diagnosis not present

## 2017-07-11 DIAGNOSIS — R7309 Other abnormal glucose: Secondary | ICD-10-CM | POA: Diagnosis not present

## 2017-07-11 DIAGNOSIS — Z1389 Encounter for screening for other disorder: Secondary | ICD-10-CM | POA: Diagnosis not present

## 2017-07-11 DIAGNOSIS — I10 Essential (primary) hypertension: Secondary | ICD-10-CM | POA: Diagnosis not present

## 2017-12-19 ENCOUNTER — Ambulatory Visit: Payer: 59 | Admitting: Family

## 2017-12-19 ENCOUNTER — Other Ambulatory Visit (HOSPITAL_COMMUNITY): Payer: 59

## 2018-02-04 IMAGING — CT CT CTA ABD/PEL W/CM AND/OR W/O CM
2 of 6 series · 14 of 46 positions shown, 16 images · IV contrast (ISOVUE)
Comparison: 02/11/2015

CLINICAL DATA: Abdominal aortic aneurysm

EXAM:
CT ANGIOGRAPHY ABDOMEN AND PELVIS
TECHNIQUE: Multidetector CT imaging of the abdomen and pelvis was performed
using the standard protocol during bolus administration of
intravenous contrast. Multiplanar reconstructed images including
MIPs were obtained and reviewed to evaluate the vascular anatomy.
CONTRAST:  100 cc Isovue 370

[Series 5: arterial · axial · arterial · 0.73mm/px · z∈[-672,-240]mm · 11 of 253 slices shown, 13 images]
[im 19/253  soft-tissue]
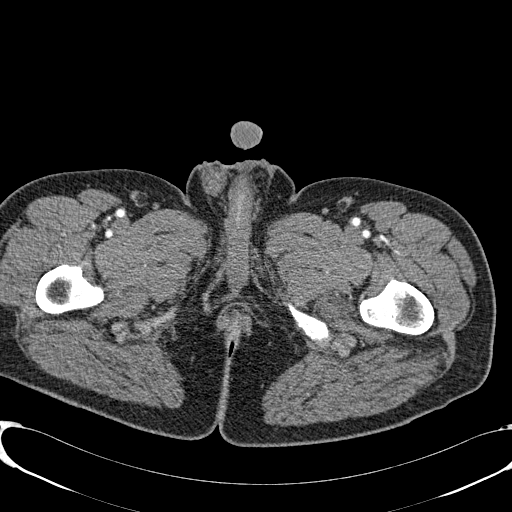
[im 19/253  bone]
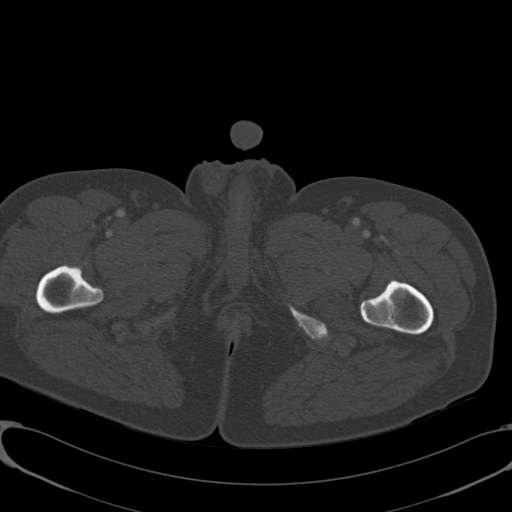
[im 37/253  soft-tissue]
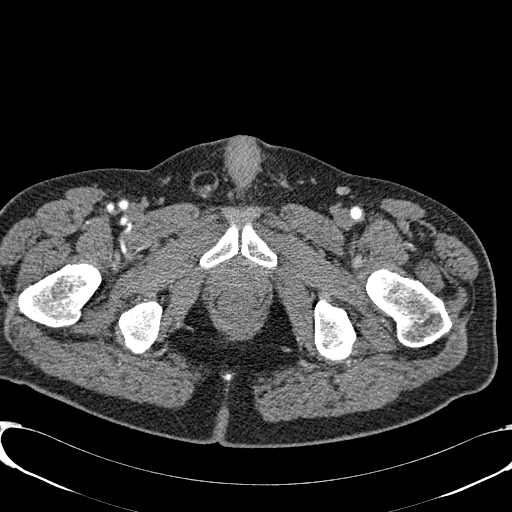
[im 64/253  soft-tissue]
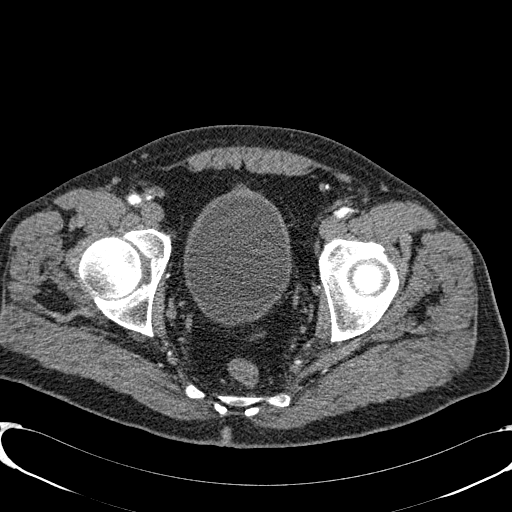
[im 82/253  soft-tissue]
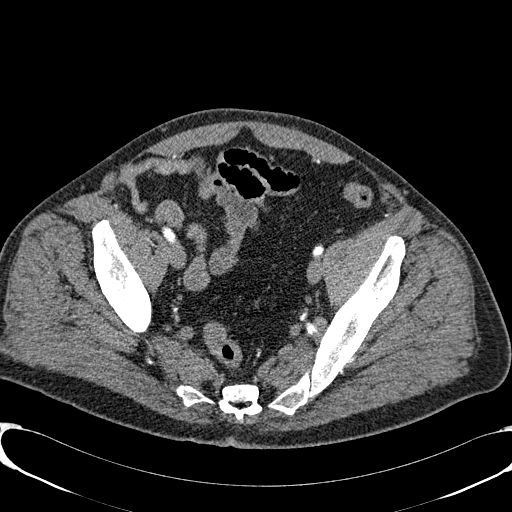
[im 109/253  soft-tissue]
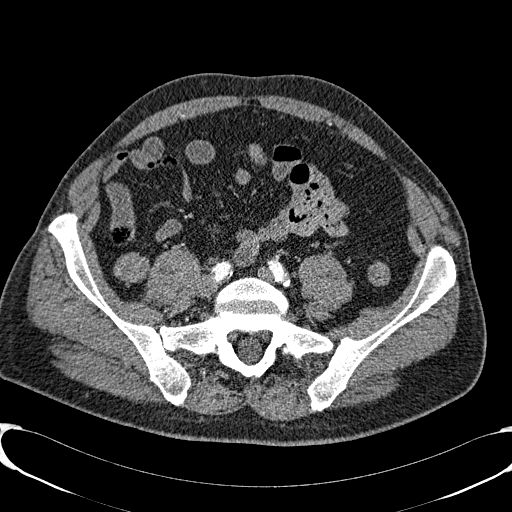
[im 127/253  soft-tissue]
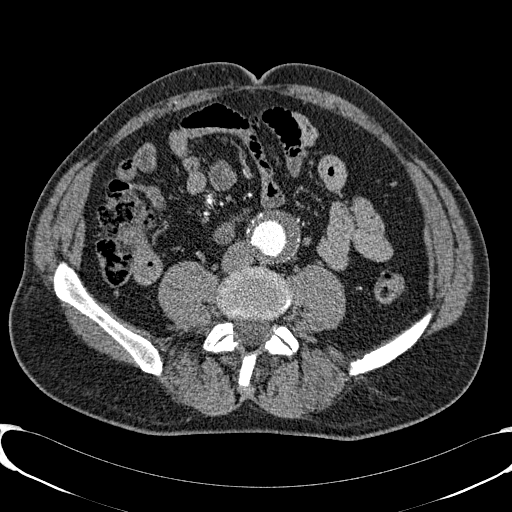
[im 145/253  soft-tissue]
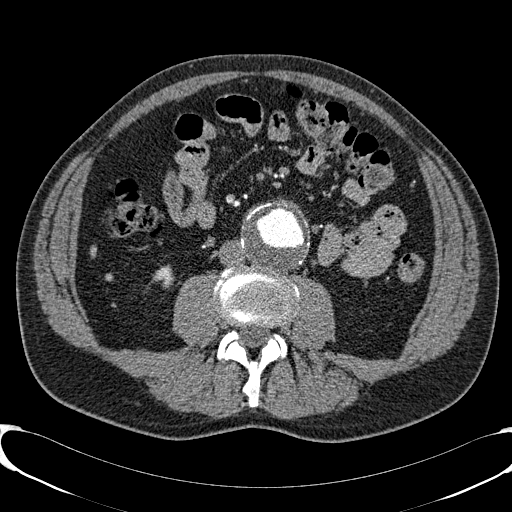
[im 172/253  soft-tissue]
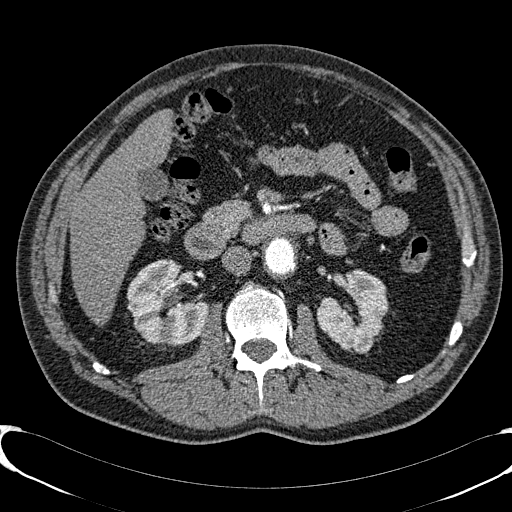
[im 190/253  soft-tissue]
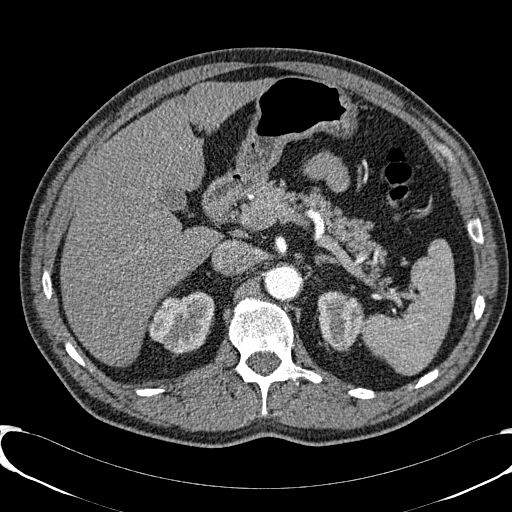
[im 190/253  bone]
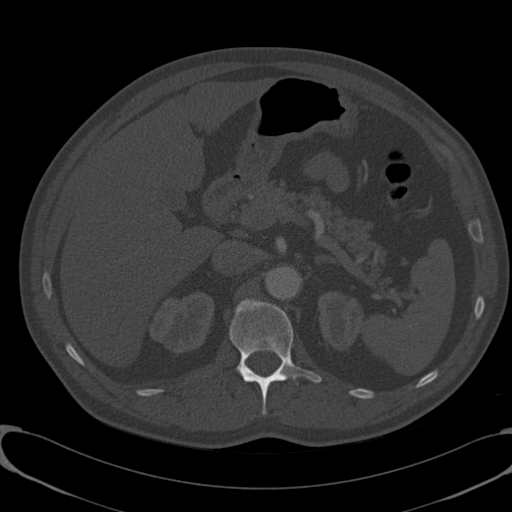
[im 217/253  soft-tissue]
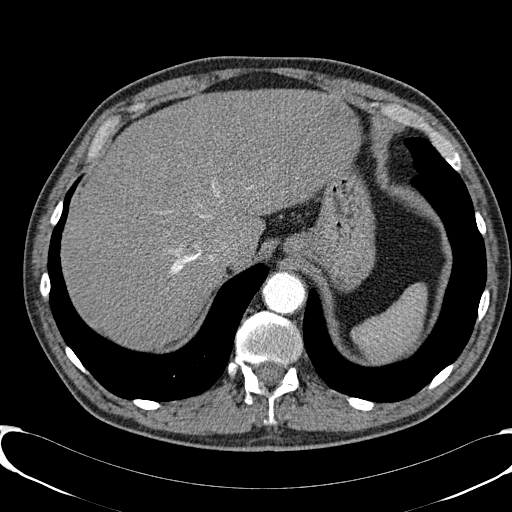
[im 235/253  soft-tissue]
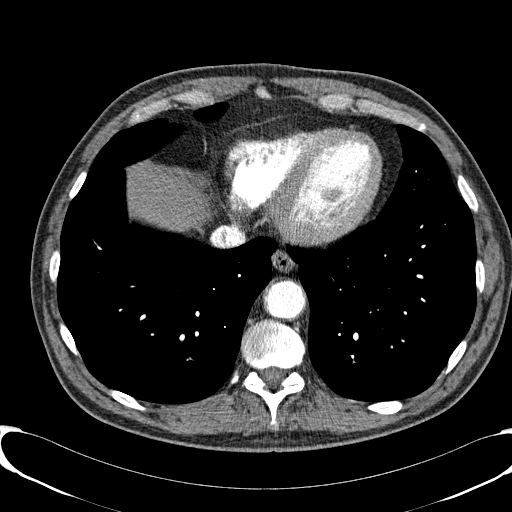

[Series 7: coronal arterial · coronal · arterial · 0.72mm/px · 3 of 157 slices shown]
[im 40/157  soft-tissue]
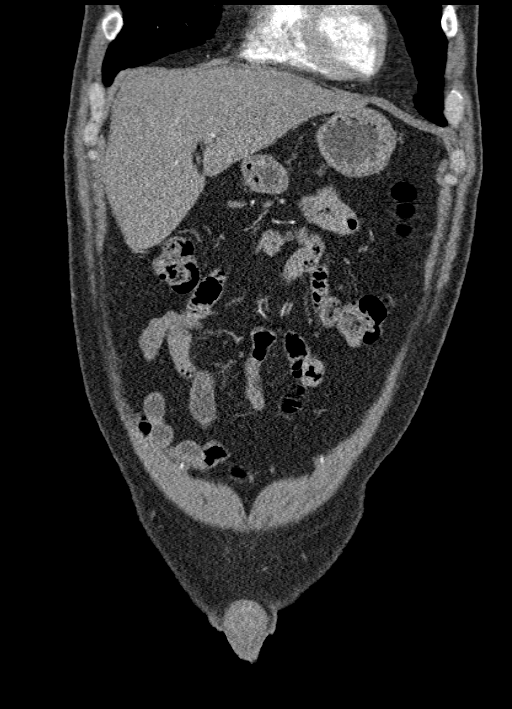
[im 79/157  soft-tissue]
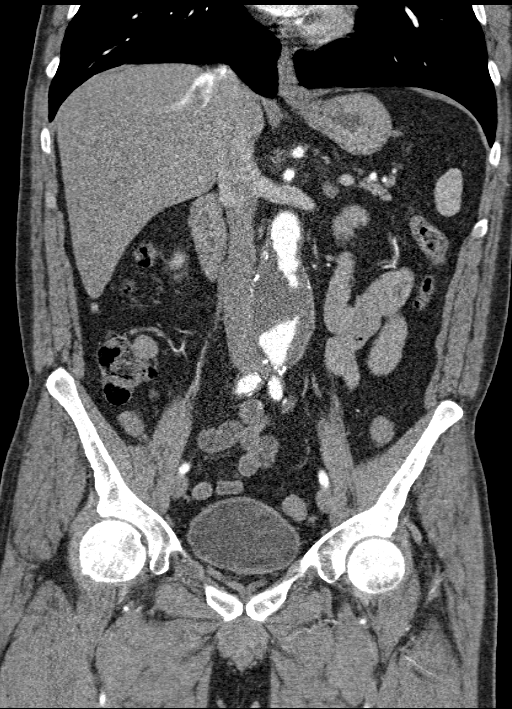
[im 118/157  soft-tissue]
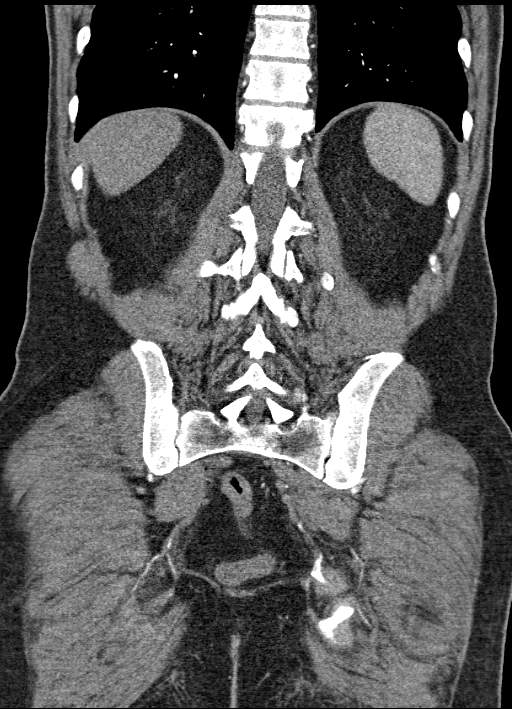

[14 of 46 positions shown; findings below may reference images not displayed]

FINDINGS: Arterial findings:

Aorta: Atherosclerotic fusiform infrarenal abdominal aortic aneurysm
noted. Circumferential mural thrombus evident. Central lumen remains
patent. Maximal AP dimension 5.1 cm, transverse dimension 5.2 cm.
Slight interval enlargement, previously measuring 5 x 5 cm. Aneurysm
terminates at the bifurcation.

Celiac axis: Widely patent. Splenic, left gastric and hepatic
branches are patent.

Superior mesenteric:  Widely patent.

Left renal: Widely patent origin. No occlusive process. No accessory
renal vasculature.

Right renal: Widely patent origin. No occlusive process. No
accessory renal artery.

Inferior mesenteric: Origin is occluded off of the aneurysm
anteriorly. IMA is reconstituted via SMA collaterals.

Left iliac: Diffuse atherosclerotic changes. Left common, internal,
and external iliac arteries are tortuous but patent. No iliac
occlusion, aneurysm or dissection. Visualize left common femoral,
proximal profunda femoral, and proximal superficial femoral arteries
are also patent.

Right iliac: Similar diffuse atherosclerotic changes. Right common,
internal, and external iliac arteries are mildly tortuous but remain
patent. No occlusive process. Visualize right common femoral,
profunda femoral, and superficial femoral arteries proximally are
all patent.

Venous findings:      Venous phase was not performed.

Review of the MIP images confirms the above findings.

Nonvascular findings: Lower chest: Clear lung bases. Normal heart
size. No pericardial or pleural effusion.

Abdomen pelvis: Liver demonstrates no biliary dilatation or definite
focal hepatic abnormality. Gallbladder and biliary system
unremarkable. Small transient hepatic attenuation defect in the left
hepatic lobe anteriorly, image 40 measuring 5 mm. No other
significant hepatic abnormality.

Pancreas, spleen, and adrenal glands within normal limits for age
and demonstrate no acute process.

Kidneys demonstrate diffuse scattered cortical atrophy without
obstruction or hydronephrosis. No acute process.

Negative for bowel obstruction, significant dilatation, ileus, or
free air. Normal appendix demonstrated. No abdominal free fluid or
fluid collection.

No pelvic free fluid, fluid collection, hemorrhage or abscess. No
inguinal abnormality or significant hernia. Urinary bladder
unremarkable. Intact abdominal wall. No ventral hernia.

No acute osseous finding demonstrated.
IMPRESSION: 5.1 x 5.2 cm fusiform infrarenal abdominal aortic aneurysm with
mural thrombus. Slight interval enlargement. Measurements as above.

No other acute intra-abdominal or pelvic finding by arterial phase
CT.

Recommend followup by abdomen and pelvis CTA in 3-6 months, and
vascular surgery referral/consultation if not already obtained. This
recommendation follows ACR consensus guidelines: White Paper of the
ACR Incidental Findings Committee II on Vascular Findings. [HOSPITAL] 8538; [DATE].

## 2018-02-09 ENCOUNTER — Ambulatory Visit (HOSPITAL_COMMUNITY)
Admission: RE | Admit: 2018-02-09 | Discharge: 2018-02-09 | Disposition: A | Discharge status: Home / Self Care | Payer: 59 | Source: Ambulatory Visit | Attending: Family | Admitting: Family

## 2018-02-09 ENCOUNTER — Encounter: Payer: Self-pay | Admitting: Family

## 2018-02-09 ENCOUNTER — Other Ambulatory Visit: Payer: Self-pay

## 2018-02-09 ENCOUNTER — Ambulatory Visit (INDEPENDENT_AMBULATORY_CARE_PROVIDER_SITE_OTHER): Payer: 59 | Admitting: Family

## 2018-02-09 VITALS — BP 183/98 | HR 76 | Temp 97.5°F | Resp 18 | Ht 73.5 in | Wt 199.0 lb

## 2018-02-09 DIAGNOSIS — F17200 Nicotine dependence, unspecified, uncomplicated: Secondary | ICD-10-CM | POA: Diagnosis not present

## 2018-02-09 DIAGNOSIS — I714 Abdominal aortic aneurysm, without rupture, unspecified: Secondary | ICD-10-CM

## 2018-02-09 DIAGNOSIS — I739 Peripheral vascular disease, unspecified: Secondary | ICD-10-CM

## 2018-02-09 DIAGNOSIS — Z95828 Presence of other vascular implants and grafts: Secondary | ICD-10-CM | POA: Diagnosis not present

## 2018-02-09 DIAGNOSIS — Z8679 Personal history of other diseases of the circulatory system: Secondary | ICD-10-CM

## 2018-02-09 DIAGNOSIS — I10 Essential (primary) hypertension: Secondary | ICD-10-CM

## 2018-02-09 NOTE — Progress Notes (Signed)
[facility] [place]  CC: Follow up s/p Endovascular Repair of Abdominal Aortic Aneurysm    History of Present Illness  Shane Faulkner is a 57 y.o. (Sep 16, 1960) male who returns for follow-up of his stent graft repair abdominal aortic aneurysm on 01/29/2016 by Dr. Donnetta Hutching.  No symptoms referable to his aneurysm.  Dr. Donnetta Hutching last evaluated pt on 12-13-16. At that time EVAR duplex showed a slight decrease in aneurysm sac size compared to study from May 2017. Maximal diameter at that time was 5.2 cm. Stable follow-up of stent graft repair. Dr. Donnetta Hutching advised continue full activity without limitation. Pt was to follow up in one year with repeat duplex ultrasound of his aorta to rule out any expansion in size.  Pt denies any back pain, states he has a hx of gas and pain associated with gas.   He denies chest pain, denies dyspnea, denies headache. His blood pressure is elevated now.   He denies any known history of stroke or TIA, denies any hx of MI.   Claudication sx's in both calves, right calf first at about 200 yards, pedal pulses are not palpable, no signs of ischemia in his feet or legs.    Diabetic: No Tobaccos use: smoker  (1-2 ppd, started at age 51 yrs)   Past Medical History:  Diagnosis Date  . AAA (abdominal aortic aneurysm) ([place])    Followed by Dr. Donnetta Hutching  . Anxiety   . Essential hypertension   . GERD (gastroesophageal reflux disease)   . Headache(784.0)   . Hyperlipidemia   . Left testicular cancer ([place]) 06/2012   a. s/p L orchiectomy  . Needle phobia    Past Surgical History:  Procedure Laterality Date  . ABDOMINAL AORTIC ENDOVASCULAR STENT GRAFT N/A 01/29/2016   Procedure: ABDOMINAL AORTIC ENDOVASCULAR STENT GRAFT;  Surgeon: [name], MD;  Location: [facility];  Service: Vascular;  Laterality: N/A;  . ORCHIECTOMY  06/05/2012   Procedure: ORCHIECTOMY;  Surgeon: Franchot Gallo, MD;  Location: [facility];  Service: Urology;  Laterality: Left;  Left  Inguinal Orchiectomy  . TONSILLECTOMY  1968   Social History Social History   Tobacco Use  . Smoking status: Current Every Day Smoker    Packs/day: 1.50    Years: 32.00    Pack years: 48.00    Types: Cigarettes    Start date: 12/08/1978  . Smokeless tobacco: Never Used  Substance Use Topics  . Alcohol use: Yes    Alcohol/week: 24.0 standard drinks    Types: 24 Cans of beer per week  . Drug use: No   Family History Family History  Problem Relation Age of Onset  . AAA (abdominal aortic aneurysm) Father   . Hyperlipidemia Father    Current Outpatient Medications on File Prior to Visit  Medication Sig Dispense Refill  . aspirin 81 MG EC tablet Take 81 mg by mouth every morning. Swallow whole.    . carvedilol (COREG) 12.5 MG tablet TAKE 1 TABLET (12.5MG ) BY MOUTH TWICE DAILY. 60 tablet 0  . pantoprazole (PROTONIX) 40 MG tablet Take 40 mg by mouth daily.    Marland Kitchen amLODipine-olmesartan (AZOR) 10-40 MG tablet TAKE ONE TABLET BY MOUTH ONCE DAILY. (Patient not taking: Reported on 02/09/2018) 30 tablet 0  . hydrALAZINE (APRESOLINE) 25 MG tablet Take 1 tablet (25 mg total) by mouth 2 (two) times daily. 180 tablet 3  . sertraline (ZOLOFT) 25 MG tablet Take 25 mg by mouth at bedtime as needed (for mood).  No current facility-administered medications on file prior to visit.    Allergies  Allergen Reactions  . Other Anaphylaxis    Cashews  . Penicillins Anaphylaxis    Has patient had a PCN reaction causing immediate rash, facial/tongue/throat swelling, SOB or lightheadedness with hypotension: Yes Has patient had a PCN reaction causing severe rash involving mucus membranes or skin necrosis: No Has patient had a PCN reaction that required hospitalization No Has patient had a PCN reaction occurring within the last 10 years: No If all of the above answers are "NO", then may proceed with Cephalosporin use.      ROS: See HPI for pertinent positives and negatives.  Physical  Examination  Vitals:   02/09/18 0854 02/09/18 0901  BP: (!) 195/116 (!) 183/98  Pulse: (!) 59 76  Resp: 18   Temp: (!) 97.5 F (36.4 C)   TempSrc: Oral   SpO2: 100%   Weight: 199 lb (90.3 kg)   Height: 6' 1.5" (1.867 m)    Recheck of blood pressure manually, left arm: 182/100  Body mass index is 25.9 kg/m.  General: A&O x 3, WD, male, odor of cigarette smoke on his person HEENT: No gross abnormalities  Pulmonary: Sym exp, respirations are non labored, good air movement in all fields CTAB, no rales, rhonchi, or wheezes.  Cardiac: Regular rhythm and rate, no murmur appreciated  Vascular: Vessel Right Left  Radial 2+Palpable 2+Palpable  Carotid  without bruit  without bruit  Aorta Not palpable N/A  Femoral 2+Palpable 1+Palpable  Popliteal Not palpable Not palpable  PT Not Palpable Not Palpable  DP Not Palpable Not Palpable   Gastrointestinal: soft, NTND, -G/R, - HSM, - palpable masses, - CVAT B. Musculoskeletal: M/S 5/5 throughout, extremities without ischemic changes. Skin: No rashes, no ulcers, no cellulitis.  Feet and toes are pink and warm.  Neurologic: Pain and light touch intact in extremities, Motor exam as listed above. CN 2-12 intact, except has some hearing loss.  Psychiatric: Normal thought content, mood appropriate for clinical situation.     DATA  EVAR Duplex   Previous (Date: 12-13-16) AAA sac size: 5.2 cm.  Current (Date: 02-09-18)  AAA sac size: 4.72 cm; Right CIA: not visualized; Left CIA: not visualized Technically difficult and limited exam due to excessive bowel gas.     Medical Decision Making  Shane Faulkner is a 57 y.o. male who presents s/p EVAR (Date: 01/29/2016).  Pt is asymptomatic, unable to adequately determine sac size due to limited visualization secondary to excessive bowel gas.   EVAR duplex: unable to visualize much due to bowel gas. I spoke with Dr. Donzetta Matters re this; will schedule CTA abd/pelvis in a few month, 3 months, will  also schedule ABI's for pt claudication sx's in both calves, right calf first at about 200 yards, pedal pulses are not palpable, no signs of ischemia in his feet or legs.  See Dr. Donnetta Hutching afterward.   Over 3 minutes was spent counseling patient re smoking cessation, and patient was given several free resources re smoking cessation.  I advised pt to notify his cardiologist office ASAP today (not listed on his list or providers, and pt does not remember the name), re his asymptomatic uncontrolled hypertension, states is with [facility] cardiology.     I discussed with the patient the importance of surveillance of the endograft.  The next CTA will be scheduled for 3 months, will also check ABI's.  The patient will follow up with [place] in 3 months  with these studies.  I emphasized the importance of maximal medical management including strict control of blood pressure, blood glucose, and lipid levels, antiplatelet agents, obtaining regular exercise, and cessation of smoking.   Thank you for allowing [place] to participate in this patient's care.  Clemon Chambers, RN, MSN, FNP-C [facility] [place] Office: [phone]  Clinic Physician: Donzetta Matters  02/09/2018, 9:29 AM

## 2018-02-09 NOTE — Patient Instructions (Addendum)
Steps to Quit Smoking Smoking tobacco can be bad for your health. It can also affect almost every organ in your body. Smoking puts you and people around you at risk for many serious long-lasting (chronic) diseases. Quitting smoking is hard, but it is one of the best things that you can do for your health. It is never too late to quit. What are the benefits of quitting smoking? When you quit smoking, you lower your risk for getting serious diseases and conditions. They can include:  Lung cancer or lung disease.  Heart disease.  Stroke.  Heart attack.  Not being able to have children (infertility).  Weak bones (osteoporosis) and broken bones (fractures).  If you have coughing, wheezing, and shortness of breath, those symptoms may get better when you quit. You may also get sick less often. If you are pregnant, quitting smoking can help to lower your chances of having a baby of low birth weight. What can I do to help me quit smoking? Talk with your doctor about what can help you quit smoking. Some things you can do (strategies) include:  Quitting smoking totally, instead of slowly cutting back how much you smoke over a period of time.  Going to in-person counseling. You are more likely to quit if you go to many counseling sessions.  Using resources and support systems, such as: ? Online chats with a counselor. ? Phone quitlines. ? Printed self-help materials. ? Support groups or group counseling. ? Text messaging programs. ? Mobile phone apps or applications.  Taking medicines. Some of these medicines may have nicotine in them. If you are pregnant or breastfeeding, do not take any medicines to quit smoking unless your doctor says it is okay. Talk with your doctor about counseling or other things that can help you.  Talk with your doctor about using more than one strategy at the same time, such as taking medicines while you are also going to in-person counseling. This can help make  quitting easier. What things can I do to make it easier to quit? Quitting smoking might feel very hard at first, but there is a lot that you can do to make it easier. Take these steps:  Talk to your family and friends. Ask them to support and encourage you.  Call phone quitlines, reach out to support groups, or work with a counselor.  Ask people who smoke to not smoke around you.  Avoid places that make you want (trigger) to smoke, such as: ? Bars. ? Parties. ? Smoke-break areas at work.  Spend time with people who do not smoke.  Lower the stress in your life. Stress can make you want to smoke. Try these things to help your stress: ? Getting regular exercise. ? Deep-breathing exercises. ? Yoga. ? Meditating. ? Doing a body scan. To do this, close your eyes, focus on one area of your body at a time from head to toe, and notice which parts of your body are tense. Try to relax the muscles in those areas.  Download or buy apps on your mobile phone or tablet that can help you stick to your quit plan. There are many free apps, such as QuitGuide from the CDC (Centers for Disease Control and Prevention). You can find more support from smokefree.gov and other websites.  This information is not intended to replace advice given to you by your health care provider. Make sure you discuss any questions you have with your health care provider. Document Released: 02/12/2009 Document   Revised: 12/15/2015 Document Reviewed: 09/02/2014 Elsevier Interactive Patient Education  2018 Elsevier Inc.  

## 2018-02-09 NOTE — Progress Notes (Signed)
Vitals:   02/09/18 0854  BP: (!) 195/116  Pulse: (!) 59  Resp: 18  Temp: (!) 97.5 F (36.4 C)  TempSrc: Oral  SpO2: 100%  Weight: 199 lb (90.3 kg)  Height: 6' 1.5" (1.867 m)

## 2018-02-14 ENCOUNTER — Other Ambulatory Visit: Payer: Self-pay

## 2018-02-14 DIAGNOSIS — I714 Abdominal aortic aneurysm, without rupture, unspecified: Secondary | ICD-10-CM

## 2018-02-20 NOTE — Progress Notes (Signed)
Cardiology Office Note  Date: 02/22/2018   ID: Shane Faulkner, DOB [birth year], MRN [id number]  PCP: Sharilyn Sites, MD  Primary Cardiologist: Rozann Lesches, MD   Chief Complaint  Patient presents with  . Hypertension    History of Present Illness: Shane Faulkner is a 57 y.o. male that I have not seen in the office since June 2017.  He was seen at that point for preoperative evaluation prior to endovascular stent graft repair of an abdominal aortic aneurysm which occurred in September 2017 per Dr. Donnetta Hutching.  He just had a recent follow-up vascular visit.  He is referred back to the office by Ms. Nickel NP with concerns regarding uncontrolled hypertension - patient has a regular PCP, Dr. Hilma Favors.  He is here today with his wife.  We went over his medications.  He has continued on Coreg, hydralazine, and Azor.  He does not report any chest pain or breathlessness.  He has been trying to quit smoking by cutting back.  We discussed various treatment strategies today and he has agreed to try Chantix.  I personally reviewed his ECG today which shows normal sinus rhythm.  Past Medical History:  Diagnosis Date  . AAA (abdominal aortic aneurysm) ([place]ulk[place])    Followed by Dr. Donnetta Hutching  . Anxiety   . Essential hypertension   . GERD (gastroesophageal reflux disease)   . Headache(784.0)   . Hyperlipidemia   . Left testicular cancer ([place] Faulk[place]) 06/2012   a. s/p L orchiectomy  . Needle phobia     Past Surgical History:  Procedure Laterality Date  . ABDOMINAL AORTIC ENDOVASCULAR STENT GRAFT N/A 01/29/2016   Procedure: ABDOMINAL AORTIC ENDOVASCULAR STENT GRAFT;  Surgeon: [name], MD;  Location: [place];  Service: Vascular;  Laterality: N/A;  . ORCHIECTOMY  06/05/2012   Procedure: ORCHIECTOMY;  Surgeon: Franchot Gallo, MD;  Location: [facility];  Service: Urology;  Laterality: Left;  Left Inguinal Orchiectomy  . TONSILLECTOMY  1968    Current Outpatient Medications  Medication Sig Dispense Refill  .  aspirin 81 MG EC tablet Take 81 mg by mouth every morning. Swallow whole.    . carvedilol (COREG) 12.5 MG tablet TAKE 1 TABLET (12.5MG ) BY MOUTH TWICE DAILY. 60 tablet 0  . hydrALAZINE (APRESOLINE) 50 MG tablet Take 1 tablet (50 mg total) by mouth 2 (two) times daily. 180 tablet 3  . pantoprazole (PROTONIX) 40 MG tablet Take 40 mg by mouth daily.    Marland Kitchen amLODipine-olmesartan (AZOR) 10-40 MG tablet Take 1 tablet by mouth daily. 90 tablet 3  . varenicline (CHANTIX CONTINUING MONTH PAK) 1 MG tablet Take 1 tablet (1 mg total) by mouth 2 (two) times daily. 60 tablet 2  . varenicline (CHANTIX STARTING MONTH PAK) 0.5 MG X 11 & 1 MG X 42 tablet Take one 0.5 mg tablet by mouth once daily for 3 days, then increase to one 0.5 mg tablet twice daily for 4 days, then increase to one 1 mg tablet twice daily. 53 tablet 0   No current facility-administered medications for this visit.    Allergies:  Other and Penicillins   Social History: The patient  reports that he has been smoking cigarettes. He started smoking about 39 years ago. He has a 48.00 pack-year smoking history. He has never used smokeless tobacco. He reports that he drinks about 24.0 standard drinks of alcohol per week. He reports that he does not use drugs.   ROS:  Please see the history of present illness.  Otherwise, complete review of systems is positive for chronic leg pain/claudication - further work-up pending per vascular team.  All other systems are reviewed and negative.   Physical Exam: VS:  BP (!) 150/86 (BP Location: Right Arm)   Pulse 88   Ht 6\' 1"  (1.854 m)   Wt 202 lb (91.6 kg)   SpO2 98%   BMI 26.65 kg/m , BMI Body mass index is 26.65 kg/m.  Wt Readings from Last 3 Encounters:  02/22/18 202 lb (91.6 kg)  02/09/18 199 lb (90.3 kg)  12/13/16 196 lb 9.6 oz (89.2 kg)    General: Patient appears comfortable at rest. HEENT: Conjunctiva and lids normal, oropharynx clear. Neck: Supple, no elevated JVP or carotid bruits, no  thyromegaly. Lungs: Clear to auscultation, nonlabored breathing at rest. Cardiac: Regular rate and rhythm, no significant systolic murmur. Abdomen: Soft, nontender, bowel sounds present. Extremities: No pitting edema, distal pulses diminished. Skin: Warm and dry. Musculoskeletal: No kyphosis. Neuropsychiatric: Alert and oriented x3, affect grossly appropriate.  ECG: I personally reviewed the tracing from 12/08/2015 which showed normal sinus rhythm.  Recent Labwork:  September 2017: BUN 10, creatinine 1.0, potassium 3.9, hemoglobin 14.9, platelets 271  Other Studies Reviewed Today:  Abdominal ultrasound 02/09/2018: Summary: Abdominal Aorta: Technically difficult and limited exam due to excessive bowel gas. The largest aortic diameter has decreased compared to prior exam. Previous diameter measurement was 5.2 cm obtained on 12/13/2016.  Assessment and Plan:  1.  Essential hypertension.  Blood pressure is mildly elevated today.  Recommend continuing Coreg, Azor, and increase hydralazine to 50 mg twice daily.  Follow-up nurse visit with blood pressure check in 2 weeks.  He also needs to keep regular follow-up with PCP otherwise.  2.  Tobacco abuse. The patient was counseled on tobacco cessation today for 5 minutes.  Counseling included reviewing the risks of smoking tobacco products, how it impacts the patient's current medical diagnoses and different strategies for quitting.  Pharmacotherapy to aid in tobacco cessation was prescribed today.  He will start on Chantix.  3.  Infrarenal abdominal aortic aneurysm status post endovascular repair with follow-up per Dr. Donnetta Hutching.  Current medicines were reviewed with the patient today.   Orders Placed This Encounter  Procedures  . EKG 12-Lead    Disposition: Follow-up nurse blood pressure check in 2 weeks.  Signed, Satira Sark, MD, [facility] 02/22/2018 10:03 AM    [facility] [place]. [address]  [place] [address] Phone: [phone]; Fax: [phone]

## 2018-02-22 ENCOUNTER — Encounter: Payer: Self-pay | Admitting: Cardiology

## 2018-02-22 ENCOUNTER — Ambulatory Visit: Payer: 59 | Admitting: Cardiology

## 2018-02-22 VITALS — BP 150/86 | HR 88 | Ht 73.0 in | Wt 202.0 lb

## 2018-02-22 DIAGNOSIS — I10 Essential (primary) hypertension: Secondary | ICD-10-CM | POA: Diagnosis not present

## 2018-02-22 DIAGNOSIS — Z720 Tobacco use: Secondary | ICD-10-CM | POA: Diagnosis not present

## 2018-02-22 DIAGNOSIS — I714 Abdominal aortic aneurysm, without rupture, unspecified: Secondary | ICD-10-CM

## 2018-02-22 MED ORDER — AMLODIPINE-OLMESARTAN 10-40 MG PO TABS: 1.0000 | ORAL_TABLET | Freq: Every day | ORAL | 3 refills | Status: DC

## 2018-02-22 MED ORDER — VARENICLINE TARTRATE 0.5 MG X 11 & 1 MG X 42 PO MISC: ORAL | 0 refills | Status: DC

## 2018-02-22 MED ORDER — HYDRALAZINE HCL 50 MG PO TABS: 50.0000 mg | ORAL_TABLET | Freq: Two times a day (BID) | ORAL | 3 refills | Status: DC

## 2018-02-22 MED ORDER — VARENICLINE TARTRATE 1 MG PO TABS: 1.0000 mg | ORAL_TABLET | Freq: Two times a day (BID) | ORAL | 2 refills | Status: DC

## 2018-02-22 NOTE — Patient Instructions (Signed)
Medication Instructions:  Continue to take Azor  INCREASE Hydralazine 50 mg twice a day   Take Chantix as directed   If you need a refill on your cardiac medications before your next appointment, please call your pharmacy.   Lab work: NONE If you have labs (blood work) drawn today and your tests are completely normal, you will receive your results only by: Marland Kitchen MyChart Message (if you have MyChart) OR . A paper copy in the mail If you have any lab test that is abnormal or we need to change your treatment, we will call you to review the results.  Testing/Procedures: NONE  Follow-Up: 3 months with Dr.McDowell  Any Other Special Instructions Will Be Listed Below (If Applicable). NONE

## 2018-03-08 ENCOUNTER — Ambulatory Visit: Payer: 59

## 2018-03-09 ENCOUNTER — Telehealth: Payer: Self-pay | Admitting: *Deleted

## 2018-03-09 NOTE — Telephone Encounter (Signed)
Attempted to return call to patient no answer mailbox full.

## 2018-03-13 ENCOUNTER — Ambulatory Visit (INDEPENDENT_AMBULATORY_CARE_PROVIDER_SITE_OTHER): Payer: 59

## 2018-03-13 VITALS — BP 138/88 | HR 78 | Ht 73.0 in | Wt 206.0 lb

## 2018-03-13 DIAGNOSIS — Z0130 Encounter for examination of blood pressure without abnormal findings: Secondary | ICD-10-CM | POA: Diagnosis not present

## 2018-03-13 MED ORDER — BUPROPION HCL ER (SMOKING DET) 150 MG PO TB12: ORAL_TABLET | ORAL | 3 refills | Status: DC

## 2018-03-13 NOTE — Patient Instructions (Addendum)
BP check after increasing hydralazine to 50 mg BID 138/88, reviewed by Dr.McDowell    Pt will start Bupropion ER 150 mg daily for 3 days, then, increase to twice a day dosing for 7-12 weeks.Will be re-evaluated at 2/10/22020 apt with Dr.McDowell

## 2018-04-03 ENCOUNTER — Other Ambulatory Visit: Payer: Self-pay | Admitting: Vascular Surgery

## 2018-04-03 ENCOUNTER — Other Ambulatory Visit: Payer: Self-pay

## 2018-04-03 ENCOUNTER — Ambulatory Visit (INDEPENDENT_AMBULATORY_CARE_PROVIDER_SITE_OTHER): Payer: 59 | Admitting: Vascular Surgery

## 2018-04-03 ENCOUNTER — Encounter: Payer: Self-pay | Admitting: Vascular Surgery

## 2018-04-03 ENCOUNTER — Ambulatory Visit (HOSPITAL_COMMUNITY)
Admission: RE | Admit: 2018-04-03 | Discharge: 2018-04-03 | Disposition: A | Discharge status: Home / Self Care | Payer: 59 | Source: Ambulatory Visit | Attending: Family | Admitting: Family

## 2018-04-03 VITALS — BP 170/102 | HR 89 | Temp 97.8°F | Resp 16 | Ht 73.0 in | Wt 205.6 lb

## 2018-04-03 DIAGNOSIS — I739 Peripheral vascular disease, unspecified: Secondary | ICD-10-CM | POA: Diagnosis not present

## 2018-04-03 NOTE — Progress Notes (Signed)
Vascular and Vein Specialist of [facility]  Patient name: Shane Faulkner MRN: [id number] DOB: 22-Apr-1961 Sex: male  REASON FOR VISIT: Evaluation of intermittent claudication bilateral lower extremities  HPI: Shane Faulkner is a 57 y.o. male well-known to me from a stent graft repair of infrarenal abdominal aortic aneurysm in September 2017.  He presents with increasingly severe lower extremity arterial insufficiency and intermittent claudication.  He reports that he can walk around his home and will for short distances but if he walks any length or uphill he has to stop due to discomfort in his calves.  He does not have any arterial rest pain.  Fortunately he has no tissue loss.  He reports that this is been present for many months but has become progressively more severe.  Past Medical History:  Diagnosis Date  . AAA (abdominal aortic aneurysm) ([place])    Followed by Dr. Donnetta Hutching  . Anxiety   . Essential hypertension   . GERD (gastroesophageal reflux disease)   . Headache(784.0)   . Hyperlipidemia   . Left testicular cancer ([place]) 06/2012   a. s/p L orchiectomy  . Needle phobia     Family History  Problem Relation Age of Onset  . AAA (abdominal aortic aneurysm) Father   . Hyperlipidemia Father     SOCIAL HISTORY: Social History   Tobacco Use  . Smoking status: Current Every Day Smoker    Packs/day: 1.00    Years: 32.00    Pack years: 32.00    Types: Cigarettes    Start date: 12/08/1978  . Smokeless tobacco: Never Used  Substance Use Topics  . Alcohol use: Yes    Alcohol/week: 24.0 standard drinks    Types: 24 Cans of beer per week    Allergies  Allergen Reactions  . Other Anaphylaxis    Cashews  . Penicillins Anaphylaxis    Has patient had a PCN reaction causing immediate rash, facial/tongue/throat swelling, SOB or lightheadedness with hypotension: Yes Has patient had a PCN reaction causing severe rash involving mucus membranes or  skin necrosis: No Has patient had a PCN reaction that required hospitalization No Has patient had a PCN reaction occurring within the last 10 years: No If all of the above answers are "NO", then may proceed with Cephalosporin use.     Current Outpatient Medications  Medication Sig Dispense Refill  . amLODipine-olmesartan (AZOR) 10-40 MG tablet Take 1 tablet by mouth daily. 90 tablet 3  . aspirin 81 MG EC tablet Take 81 mg by mouth every morning. Swallow whole.    Marland Kitchen buPROPion (ZYBAN) 150 MG 12 hr tablet Take 150 mg daily for 3 days, then increase to twice a day 60 tablet 3  . carvedilol (COREG) 12.5 MG tablet TAKE 1 TABLET (12.5MG ) BY MOUTH TWICE DAILY. 60 tablet 0  . hydrALAZINE (APRESOLINE) 50 MG tablet Take 1 tablet (50 mg total) by mouth 2 (two) times daily. 180 tablet 3  . pantoprazole (PROTONIX) 40 MG tablet Take 40 mg by mouth daily.     No current facility-administered medications for this visit.     REVIEW OF SYSTEMS:  [X]  denotes positive finding, [ ]  denotes negative finding Cardiac  Comments:  Chest pain or chest pressure:    Shortness of breath upon exertion:    Short of breath when lying flat:    Irregular heart rhythm:        Vascular    Pain in calf, thigh, or hip brought on by ambulation: x  Pain in feet at night that wakes you up from your sleep:  x   Blood clot in your veins:    Leg swelling:           PHYSICAL EXAM: Vitals:   04/03/18 1021  BP: (!) 170/102  Pulse: 89  Resp: 16  Temp: 97.8 F (36.6 C)  TempSrc: Oral  SpO2: 98%  Weight: 205 lb 9.6 oz (93.3 kg)  Height: 6\' 1"  (1.854 m)    GENERAL: The patient is a well-nourished male, in no acute distress. The vital signs are documented above. CARDIOVASCULAR: 2+ radial and 2+ femoral pulses.  Absent popliteal and distal pulses both lower extremities PULMONARY: There is good air exchange  MUSCULOSKELETAL: There are no major deformities or cyanosis. NEUROLOGIC: No focal weakness or paresthesias are  detected. SKIN: There are no ulcers or rashes noted.  Coolness and cyanosis in his feet bilaterally PSYCHIATRIC: The patient has a normal affect.  DATA:  Ankle arm index today is 0.54 on the left and 0.76 on the right  MEDICAL ISSUES: I discussed this at length with the patient.  I did review his most recent CT scan following a stent graft.  This shows no evidence of occlusive disease in his iliac and does have patency of his proximal superficial femoral artery and this CT scan from 2017.  By physical exam he does have occlusion of the superficial femoral artery and this would be supported with his noninvasive studies.  I explained the critical importance of smoking cessation to both reduce the risk of progression and also allowed him to have a better walking tolerance.  Also explained the importance of a walking program.  We did discuss the potential use of Pletal would reserve this at this time with limited response typically.  Is comfortable with observation only currently.  We will see him again in 3 months for continued discussion  Did have a recent ultrasound which showed no evidence of enlargement of his native sac of his aorta but somewhat limited to the bowel gas.  We will follow this up in the future as well   [name], MD [facility] Vascular and Vein Specialists of [facility] Tel [phone] Pager [phone]

## 2018-05-15 ENCOUNTER — Ambulatory Visit: Payer: 59 | Admitting: Family

## 2018-05-15 ENCOUNTER — Ambulatory Visit: Payer: 59 | Admitting: Vascular Surgery

## 2018-06-07 NOTE — Progress Notes (Deleted)
Cardiology Office Note  Date: 06/07/2018   ID: Shane Faulkner, DOB 12-23-1960, MRN [id number]  PCP: Sharilyn Sites, MD  Primary Cardiologist: Rozann Lesches, MD   No chief complaint on file.   History of Present Illness: Shane Faulkner is a 58 y.o. male last seen in October 2019.  Past Medical History:  Diagnosis Date  . AAA (abdominal aortic aneurysm) ([place])    Followed by Dr. Donnetta Hutching  . Anxiety   . Essential hypertension   . GERD (gastroesophageal reflux disease)   . Headache(784.0)   . Hyperlipidemia   . Left testicular cancer ([place]) 06/2012   a. s/p L orchiectomy  . Needle phobia     Past Surgical History:  Procedure Laterality Date  . ABDOMINAL AORTIC ENDOVASCULAR STENT GRAFT N/A 01/29/2016   Procedure: ABDOMINAL AORTIC ENDOVASCULAR STENT GRAFT;  Surgeon: [name], MD;  Location: [facility];  Service: Vascular;  Laterality: N/A;  . ORCHIECTOMY  06/05/2012   Procedure: ORCHIECTOMY;  Surgeon: Franchot Gallo, MD;  Location: [facility];  Service: Urology;  Laterality: Left;  Left Inguinal Orchiectomy  . TONSILLECTOMY  1968    Current Outpatient Medications  Medication Sig Dispense Refill  . amLODipine-olmesartan (AZOR) 10-40 MG tablet Take 1 tablet by mouth daily. 90 tablet 3  . aspirin 81 MG EC tablet Take 81 mg by mouth every morning. Swallow whole.    Marland Kitchen buPROPion (ZYBAN) 150 MG 12 hr tablet Take 150 mg daily for 3 days, then increase to twice a day 60 tablet 3  . carvedilol (COREG) 12.5 MG tablet TAKE 1 TABLET (12.5MG ) BY MOUTH TWICE DAILY. 60 tablet 0  . hydrALAZINE (APRESOLINE) 50 MG tablet Take 1 tablet (50 mg total) by mouth 2 (two) times daily. 180 tablet 3  . pantoprazole (PROTONIX) 40 MG tablet Take 40 mg by mouth daily.     No current facility-administered medications for this visit.    Allergies:  Other and Penicillins   Social History: The patient  reports that he has been smoking cigarettes. He started smoking about 39 years ago. He has a 32.00 pack-year  smoking history. He has never used smokeless tobacco. He reports current alcohol use of about 24.0 standard drinks of alcohol per week. He reports that he does not use drugs.   Family History: The patient's family history includes AAA (abdominal aortic aneurysm) in his father; Hyperlipidemia in his father.   ROS:  Please see the history of present illness. Otherwise, complete review of systems is positive for {NONE DEFAULTED:18576::"none"}.  All other systems are reviewed and negative.   Physical Exam: VS:  There were no vitals taken for this visit., BMI There is no height or weight on file to calculate BMI.  Wt Readings from Last 3 Encounters:  04/03/18 205 lb 9.6 oz (93.3 kg)  03/13/18 206 lb (93.4 kg)  02/22/18 202 lb (91.6 kg)    General: Patient appears comfortable at rest. HEENT: Conjunctiva and lids normal, oropharynx clear with moist mucosa. Neck: Supple, no elevated JVP or carotid bruits, no thyromegaly. Lungs: Clear to auscultation, nonlabored breathing at rest. Cardiac: Regular rate and rhythm, no S3 or significant systolic murmur, no pericardial rub. Abdomen: Soft, nontender, no hepatomegaly, bowel sounds present, no guarding or rebound. Extremities: No pitting edema, distal pulses 2+. Skin: Warm and dry. Musculoskeletal: No kyphosis. Neuropsychiatric: Alert and oriented x3, affect grossly appropriate.  ECG: I personally reviewed the tracing from 02/22/2018 which showed normal sinus rhythm.  Recent Labwork:  September 2017:  BUN 10, creatinine 1.0, potassium 3.9, hemoglobin 14.9, platelets 271  Other Studies Reviewed Today:  Abdominal ultrasound 02/09/2018: Summary: Abdominal Aorta: Technically difficult and limited exam due to excessive bowel gas. The largest aortic diameter has decreased compared to prior exam. Previous diameter measurement was 5.2 cm obtained on 12/13/2016.  Assessment and Plan:   Current medicines were reviewed with the patient today.  No  orders of the defined types were placed in this encounter.   Disposition:  Signed, Satira Sark, MD, [facility] 06/07/2018 10:28 AM    [place] at [facility] [address], [place], [place] [address] Phone: [phone]; Fax: [phone]

## 2018-06-11 ENCOUNTER — Ambulatory Visit: Payer: 59 | Admitting: Cardiology

## 2018-06-12 ENCOUNTER — Encounter: Payer: Self-pay | Admitting: Cardiology

## 2018-06-25 ENCOUNTER — Ambulatory Visit (HOSPITAL_COMMUNITY): Admission: RE | Admit: 2018-06-25 | Payer: 59 | Source: Ambulatory Visit

## 2018-07-03 ENCOUNTER — Encounter: Payer: Self-pay | Admitting: Vascular Surgery

## 2018-07-03 ENCOUNTER — Other Ambulatory Visit: Payer: Self-pay

## 2018-07-03 ENCOUNTER — Ambulatory Visit: Payer: 59 | Admitting: Vascular Surgery

## 2018-07-03 VITALS — BP 130/89 | HR 77 | Temp 97.9°F | Resp 20 | Ht 73.0 in | Wt 198.0 lb

## 2018-07-03 DIAGNOSIS — Z95828 Presence of other vascular implants and grafts: Secondary | ICD-10-CM

## 2018-07-03 DIAGNOSIS — I714 Abdominal aortic aneurysm, without rupture, unspecified: Secondary | ICD-10-CM

## 2018-07-03 DIAGNOSIS — F17200 Nicotine dependence, unspecified, uncomplicated: Secondary | ICD-10-CM

## 2018-07-03 DIAGNOSIS — I739 Peripheral vascular disease, unspecified: Secondary | ICD-10-CM

## 2018-07-03 NOTE — Progress Notes (Signed)
Vascular and Vein Specialist of [facility]  Patient name: Shane Faulkner MRN: [id number] DOB: Jul 18, 1960 Sex: male  REASON FOR VISIT: Discussion of lower extremity claudication  HPI: Shane Faulkner is a 58 y.o. male here today for continued discussion of his lower extremity claudication.  He reports that the left is much greater than the right.  He reports that he can do very little walking before he has to stop with the typical calf claudication mainly in his left calf.  He has mild symptoms in his right leg.  Past Medical History:  Diagnosis Date  . AAA (abdominal aortic aneurysm) ([place])    Followed by Dr. Donnetta Hutching  . Anxiety   . Essential hypertension   . GERD (gastroesophageal reflux disease)   . Headache(784.0)   . Hyperlipidemia   . Left testicular cancer ([place]) 06/2012   a. s/p L orchiectomy  . Needle phobia     Family History  Problem Relation Age of Onset  . AAA (abdominal aortic aneurysm) Father   . Hyperlipidemia Father     SOCIAL HISTORY: Social History   Tobacco Use  . Smoking status: Current Every Day Smoker    Packs/day: 1.00    Years: 32.00    Pack years: 32.00    Types: Cigarettes    Start date: 12/08/1978  . Smokeless tobacco: Never Used  Substance Use Topics  . Alcohol use: Yes    Alcohol/week: 24.0 standard drinks    Types: 24 Cans of beer per week    Allergies  Allergen Reactions  . Other Anaphylaxis    Cashews  . Penicillins Anaphylaxis    Has patient had a PCN reaction causing immediate rash, facial/tongue/throat swelling, SOB or lightheadedness with hypotension: Yes Has patient had a PCN reaction causing severe rash involving mucus membranes or skin necrosis: No Has patient had a PCN reaction that required hospitalization No Has patient had a PCN reaction occurring within the last 10 years: No If all of the above answers are "NO", then may proceed with Cephalosporin use.     Current Outpatient  Medications  Medication Sig Dispense Refill  . aspirin 81 MG EC tablet Take 81 mg by mouth every morning. Swallow whole.    Marland Kitchen buPROPion (ZYBAN) 150 MG 12 hr tablet Take 150 mg daily for 3 days, then increase to twice a day 60 tablet 3  . carvedilol (COREG) 12.5 MG tablet TAKE 1 TABLET (12.5MG ) BY MOUTH TWICE DAILY. 60 tablet 0  . pantoprazole (PROTONIX) 40 MG tablet Take 40 mg by mouth daily.    . hydrALAZINE (APRESOLINE) 50 MG tablet Take 1 tablet (50 mg total) by mouth 2 (two) times daily. 180 tablet 3   No current facility-administered medications for this visit.     REVIEW OF SYSTEMS:  [X]  denotes positive finding, [ ]  denotes negative finding Cardiac  Comments:  Chest pain or chest pressure:    Shortness of breath upon exertion:    Short of breath when lying flat:    Irregular heart rhythm:        Vascular    Pain in calf, thigh, or hip brought on by ambulation: x   Pain in feet at night that wakes you up from your sleep:     Blood clot in your veins:    Leg swelling:           PHYSICAL EXAM: Vitals:   07/03/18 0916  BP: 130/89  Pulse: 77  Resp: 20  Temp: 97.9  F (36.6 C)  SpO2: 95%  Weight: 198 lb (89.8 kg)  Height: 6\' 1"  (1.854 m)    GENERAL: The patient is a well-nourished male, in no acute distress. The vital signs are documented above. CARDIOVASCULAR: 2+ radial pulses bilaterally.  2+ right femoral pulse and markedly diminished left femoral pulse.  Absent pedal pulses bilaterally PULMONARY: There is good air exchange  MUSCULOSKELETAL: There are no major deformities or cyanosis. NEUROLOGIC: No focal weakness or paresthesias are detected. SKIN: There are no ulcers or rashes noted. PSYCHIATRIC: The patient has a normal affect.  DATA:  Noninvasive studies from December 2019 revealed ankle arm index of 0.76 on the right and 0.54 on the left  MEDICAL ISSUES: Discussed options with the patient.  He does appear to have diminished flow at the level of the groin on  the left.  I reviewed his CT angiogram for follow-up of his aortic aneurysm stent graft repair from 2017.  At that time he did not appear to have any evidence of iliac occlusive disease.  It appears as though he may have progressed proximal disease and if so should be very straightforward as far as treatment.  Have recommended CT angiogram for further evaluation.  We will obtain this at his earliest convenience and see him back in our [place] office for follow-up    [name], MD [facility] Vascular and Vein Specialists of [facility] Tel [phone] Pager [phone]

## 2018-07-20 ENCOUNTER — Ambulatory Visit (HOSPITAL_COMMUNITY): Payer: 59

## 2018-07-23 ENCOUNTER — Ambulatory Visit: Payer: 59 | Admitting: Vascular Surgery

## 2018-07-31 ENCOUNTER — Telehealth: Payer: Self-pay | Admitting: Cardiology

## 2018-07-31 MED ORDER — AMLODIPINE-OLMESARTAN 10-40 MG PO TABS: 1.0000 | ORAL_TABLET | Freq: Every day | ORAL | 3 refills | Status: DC

## 2018-07-31 NOTE — Telephone Encounter (Signed)
Please give pt a call concerning his BP, it's running high. He is out of his lisinopril, I did not see it on his med list.   Also, he'd like to reschedule his apt that he missed in Feb. He is ok w/ doing either a phone visit or a video visit if he knows how to go about setting it up.

## 2018-07-31 NOTE — Telephone Encounter (Signed)
Patient needed Azor, not lisinopril, e-scribed to pharmacy

## 2018-09-10 ENCOUNTER — Other Ambulatory Visit: Payer: Self-pay | Admitting: Cardiology

## 2018-09-18 ENCOUNTER — Other Ambulatory Visit: Payer: Self-pay

## 2018-09-18 DIAGNOSIS — I739 Peripheral vascular disease, unspecified: Secondary | ICD-10-CM

## 2018-10-16 ENCOUNTER — Ambulatory Visit (HOSPITAL_COMMUNITY)
Admission: RE | Admit: 2018-10-16 | Discharge: 2018-10-16 | Disposition: A | Discharge status: Home / Self Care | Payer: 59 | Source: Ambulatory Visit | Attending: Vascular Surgery | Admitting: Vascular Surgery

## 2018-10-16 ENCOUNTER — Other Ambulatory Visit: Payer: Self-pay

## 2018-10-16 DIAGNOSIS — I714 Abdominal aortic aneurysm, without rupture, unspecified: Secondary | ICD-10-CM

## 2018-10-16 MED ORDER — IOHEXOL 350 MG/ML SOLN: 100.0000 mL | Freq: Once | INTRAVENOUS | Status: DC | PRN

## 2018-10-23 ENCOUNTER — Encounter: Payer: Self-pay | Admitting: Vascular Surgery

## 2018-10-23 ENCOUNTER — Other Ambulatory Visit: Payer: Self-pay

## 2018-10-23 ENCOUNTER — Ambulatory Visit (INDEPENDENT_AMBULATORY_CARE_PROVIDER_SITE_OTHER): Payer: 59 | Admitting: Vascular Surgery

## 2018-10-23 VITALS — BP 140/88

## 2018-10-23 DIAGNOSIS — I739 Peripheral vascular disease, unspecified: Secondary | ICD-10-CM | POA: Diagnosis not present

## 2018-10-23 DIAGNOSIS — I714 Abdominal aortic aneurysm, without rupture, unspecified: Secondary | ICD-10-CM

## 2018-10-23 MED ORDER — CILOSTAZOL 100 MG PO TABS: 100.0000 mg | ORAL_TABLET | Freq: Two times a day (BID) | ORAL | 11 refills | Status: DC

## 2018-10-23 NOTE — Progress Notes (Signed)
    Virtual Visit via Telephone Note    I connected with Shane Faulkner on 10/23/2018  by telephone and verified that I was speaking with the correct person using two identifiers. Patient was located at home and accompanied by no one. I am located at [facility].   The limitations of evaluation and management by telemedicine and the availability of in person appointments have been previously discussed with the patient and are documented in the patients chart. The patient expressed understanding and consented to proceed.  PCP: Sharilyn Sites, MD Referring MD: Hilma Favors  Chief Complaint: Intermittent claudication  History of Present Illness: Shane Faulkner is a 58 y.o. male with known to me from prior stent graft pair of abdominal otic aneurysm.  He has been seen with increasing left leg claudication.  Is been recommended that he undergo CTA for further evaluation.  He presented for CT after the COVID restrictions were lifted in June.  He has severe needle phobia and had 3 attempts at blood draw which were unsuccessful and he did not persist.  He reports that he continues to have left leg claudication but is able to do his usual activities  Past Medical History:  Diagnosis Date  . AAA (abdominal aortic aneurysm) ([place])    Followed by Dr. Donnetta Hutching  . Anxiety   . Essential hypertension   . GERD (gastroesophageal reflux disease)   . Headache(784.0)   . Hyperlipidemia   . Left testicular cancer ([place]) 06/2012   a. s/p L orchiectomy  . Needle phobia     Past Surgical History:  Procedure Laterality Date  . ABDOMINAL AORTIC ENDOVASCULAR STENT GRAFT N/A 01/29/2016   Procedure: ABDOMINAL AORTIC ENDOVASCULAR STENT GRAFT;  Surgeon: [name], MD;  Location: [facility];  Service: Vascular;  Laterality: N/A;  . ORCHIECTOMY  06/05/2012   Procedure: ORCHIECTOMY;  Surgeon: Franchot Gallo, MD;  Location: [facility];  Service: Urology;  Laterality: Left;  Left Inguinal Orchiectomy  . TONSILLECTOMY  1968    Current Meds  Medication Sig  . amLODipine-olmesartan (AZOR) 10-40 MG tablet Take 1 tablet by mouth daily.  Marland Kitchen aspirin 81 MG EC tablet Take 81 mg by mouth every morning. Swallow whole.  . carvedilol (COREG) 12.5 MG tablet TAKE 1 TABLET (12.5MG ) BY MOUTH TWICE DAILY.  . pantoprazole (PROTONIX) 40 MG tablet Take 40 mg by mouth daily.    12 system ROS was negative unless otherwise noted in HPI   Observations/Objective:  I did offer Ativan prior to CT scan.  We also discussed cilostazol which we had discussed in the past as an option. Assessment and Plan:  Wishes to proceed with a trial of cilostazol.  I have called this into his pharmacy Follow Up Instructions:   Follow up 3 months with ankle arm index   I discussed the assessment and treatment plan with the patient. The patient was provided an opportunity to ask questions and all were answered. The patient agreed with the plan and demonstrated an understanding of the instructions.   The patient was advised to call back or seek an in-person evaluation if the symptoms worsen or if the condition fails to improve as anticipated.  I spent 5-10 minutes with the patient via telephone encounter.   Annamary Rummage Vascular and Vein Specialists of [place] Office: [phone]  10/23/2018, 10:38 AM

## 2018-10-26 ENCOUNTER — Other Ambulatory Visit: Payer: Self-pay | Admitting: Cardiology

## 2019-01-26 ENCOUNTER — Other Ambulatory Visit: Payer: Self-pay | Admitting: Cardiology

## 2019-01-28 ENCOUNTER — Telehealth (HOSPITAL_COMMUNITY): Payer: Self-pay | Admitting: *Deleted

## 2019-01-28 ENCOUNTER — Other Ambulatory Visit: Payer: Self-pay

## 2019-01-28 DIAGNOSIS — I739 Peripheral vascular disease, unspecified: Secondary | ICD-10-CM

## 2019-01-28 NOTE — Telephone Encounter (Signed)
The above patient or their representative was contacted and gave the following answers to these questions:         Do you have any of the following symptoms?n  Fever                    Cough                   Shortness of breath  Do  you have any of the following other symptoms? n   muscle pain         vomiting,        diarrhea        rash         weakness        red eye        abdominal pain         bruising          bruising or bleeding              joint pain           severe headache    Have you been in contact with someone who was or has been sick in the past 2 weeks?n  Yes                 Unsure                         Unable to assess   Does the person that you were in contact with have any of the following symptoms?   Cough         shortness of breath           muscle pain         vomiting,            diarrhea            rash            weakness           fever            red eye           abdominal pain           bruising  or  bleeding                joint pain                severe headache               Have you  or someone you have been in contact with traveled internationally in th last month?         If yes, which countries?   Have you  or someone you have been in contact with traveled outside [place] in th last month?         If yes, which state and city?   COMMENTS OR ACTION PLAN FOR THIS PATIENT:         

## 2019-01-29 ENCOUNTER — Encounter: Payer: Self-pay | Admitting: Vascular Surgery

## 2019-01-29 ENCOUNTER — Other Ambulatory Visit: Payer: Self-pay

## 2019-01-29 ENCOUNTER — Ambulatory Visit: Payer: 59 | Admitting: Vascular Surgery

## 2019-01-29 ENCOUNTER — Ambulatory Visit (HOSPITAL_COMMUNITY)
Admission: RE | Admit: 2019-01-29 | Discharge: 2019-01-29 | Disposition: A | Discharge status: Home / Self Care | Payer: 59 | Source: Ambulatory Visit | Attending: Family | Admitting: Family

## 2019-01-29 VITALS — BP 149/102 | HR 89 | Temp 97.9°F | Resp 20 | Ht 73.0 in | Wt 197.0 lb

## 2019-01-29 DIAGNOSIS — I739 Peripheral vascular disease, unspecified: Secondary | ICD-10-CM | POA: Insufficient documentation

## 2019-01-29 DIAGNOSIS — Z95828 Presence of other vascular implants and grafts: Secondary | ICD-10-CM | POA: Diagnosis not present

## 2019-01-29 NOTE — Progress Notes (Signed)
Vascular and Vein Specialist of [facility]  Patient name: Shane Faulkner MRN: [id number] DOB: Jan 29, 1961 Sex: male  REASON FOR VISIT: Continued follow-up of intermittent claudication  HPI: Shane Faulkner is a 58 y.o. male here today for discussion of his lower extremity arterial insufficiency.  He had prior endograft repair of abdominal aortic aneurysm at an [place] age.  He has had limiting claudication in his left lower extremity.  He has severe needle phobia and was unable to have a CT angiogram for further evaluation of this earlier this year.  We did try a trial of cilostazol and he has had significant improvement in his walking.  He reports that he has had near total resolution of his left calf claudication.  He does report what sounds like neuropathic pain in his left foot.  He notes some numbness over his toes.  No tissue loss.  No arterial rest pain waking him in the night.  Past Medical History:  Diagnosis Date  . AAA (abdominal aortic aneurysm) ([place])    Followed by Dr. Donnetta Hutching  . Anxiety   . Essential hypertension   . GERD (gastroesophageal reflux disease)   . Headache(784.0)   . Hyperlipidemia   . Left testicular cancer ([place]) 06/2012   a. s/p L orchiectomy  . Needle phobia     Family History  Problem Relation Age of Onset  . AAA (abdominal aortic aneurysm) Father   . Hyperlipidemia Father     SOCIAL HISTORY: Social History   Tobacco Use  . Smoking status: Current Every Day Smoker    Packs/day: 1.00    Years: 32.00    Pack years: 32.00    Types: Cigarettes    Start date: 12/08/1978  . Smokeless tobacco: Never Used  Substance Use Topics  . Alcohol use: Yes    Alcohol/week: 24.0 standard drinks    Types: 24 Cans of beer per week    Allergies  Allergen Reactions  . Other Anaphylaxis    Cashews  . Penicillins Anaphylaxis    Has patient had a PCN reaction causing immediate rash, facial/tongue/throat swelling, SOB or  lightheadedness with hypotension: Yes Has patient had a PCN reaction causing severe rash involving mucus membranes or skin necrosis: No Has patient had a PCN reaction that required hospitalization No Has patient had a PCN reaction occurring within the last 10 years: No If all of the above answers are "NO", then may proceed with Cephalosporin use.     Current Outpatient Medications  Medication Sig Dispense Refill  . amLODipine-olmesartan (AZOR) 10-40 MG tablet Take 1 tablet by mouth daily. 90 tablet 3  . aspirin 81 MG EC tablet Take 81 mg by mouth every morning. Swallow whole.    . carvedilol (COREG) 12.5 MG tablet TAKE 1 TABLET (12.5MG ) BY MOUTH TWICE DAILY. 180 tablet 0  . cilostazol (PLETAL) 100 MG tablet Take 1 tablet (100 mg total) by mouth 2 (two) times daily before a meal. 60 tablet 11  . pantoprazole (PROTONIX) 40 MG tablet Take 40 mg by mouth daily.    . hydrALAZINE (APRESOLINE) 50 MG tablet Take 1 tablet (50 mg total) by mouth 2 (two) times daily. 180 tablet 3   No current facility-administered medications for this visit.     REVIEW OF SYSTEMS:  [X]  denotes positive finding, [ ]  denotes negative finding Cardiac  Comments:  Chest pain or chest pressure:    Shortness of breath upon exertion:    Short of breath when lying flat:  Irregular heart rhythm:        Vascular    Pain in calf, thigh, or hip brought on by ambulation:    Pain in feet at night that wakes you up from your sleep:     Blood clot in your veins:    Leg swelling:           PHYSICAL EXAM: Vitals:   01/29/19 1243  BP: (!) 149/102  Pulse: 89  Resp: 20  Temp: 97.9 F (36.6 C)  SpO2: 98%  Weight: 197 lb (89.4 kg)  Height: 6\' 1"  (1.854 m)    GENERAL: The patient is a well-nourished male, in no acute distress. The vital signs are documented above. CARDIOVASCULAR: 2+ radial pulses bilaterally.  1+ right and 2+ left femoral pulses.  Absent distal pulses PULMONARY: There is good air exchange   MUSCULOSKELETAL: There are no major deformities or cyanosis. NEUROLOGIC: No focal weakness or paresthesias are detected. SKIN: There are no ulcers or rashes noted. PSYCHIATRIC: The patient has a normal affect.  DATA:  Noninvasive studies reveal stable ankle arm index at 0.79 on the right and 0.48 on the left  MEDICAL ISSUES: Patient has had good response to cilostazol with resolution of his claudication.  We will continue close eye on his feet and will notify should he develop any nonhealing ulceration.  I do not feel that his resting symptoms in his left foot are related to critical ischemia.  He is status post endograft repair of down aortic aneurysm in September 2017.  He had duplex of this 1 year ago.  He had had a diminished aneurysm sac size at that time.  We will see him again in 1 year with repeat ultrasound of his aorta and also continued follow-up of his ankle arm index.    [name], MD FACS Vascular and Vein Specialists of [facility] Tel [phone] Pager [phone]

## 2019-02-13 ENCOUNTER — Other Ambulatory Visit: Payer: Self-pay | Admitting: Cardiology

## 2019-04-25 ENCOUNTER — Other Ambulatory Visit: Payer: Self-pay | Admitting: Cardiology

## 2019-05-13 ENCOUNTER — Other Ambulatory Visit: Payer: Self-pay

## 2019-05-13 ENCOUNTER — Ambulatory Visit: Payer: 59 | Attending: Internal Medicine

## 2019-05-13 DIAGNOSIS — Z20822 Contact with and (suspected) exposure to covid-19: Secondary | ICD-10-CM

## 2019-05-14 LAB — NOVEL CORONAVIRUS, NAA: SARS-CoV-2, NAA: NOT DETECTED

## 2019-05-20 ENCOUNTER — Telehealth: Payer: Self-pay | Admitting: Adult Health

## 2019-05-20 NOTE — Telephone Encounter (Signed)
[employer] is calling to confirm we received the forms faxed over on 05/17/19 and would like them filled out and sent back as soon as possible due to them being urgent.

## 2019-05-20 NOTE — Telephone Encounter (Signed)
Cannot reach anyone at [facility] as there was no extension # or case # provided. No paperwork is in provider mail box

## 2019-05-21 NOTE — Telephone Encounter (Signed)
Routed to primary

## 2019-05-21 NOTE — Telephone Encounter (Signed)
Calton from [employer] is calling back in regards to the statues of the information he faxed over. After speaking with [place] we came to the conclusion they had the wrong fax number and they are now sending over the information to the correct number. He states he will be calling back at 1:00 PM to check the status of fax. The fax number the paperwork is to be sent to once received and filled out is [phone].

## 2019-05-28 NOTE — Telephone Encounter (Signed)
Haven't received any forms on this pt as of yet.

## 2019-07-16 ENCOUNTER — Other Ambulatory Visit: Payer: Self-pay | Admitting: Cardiology

## 2019-08-14 ENCOUNTER — Other Ambulatory Visit: Payer: Self-pay | Admitting: Cardiology

## 2019-08-20 ENCOUNTER — Other Ambulatory Visit: Payer: Self-pay | Admitting: Cardiology

## 2019-08-26 ENCOUNTER — Other Ambulatory Visit: Payer: Self-pay | Admitting: Cardiology

## 2019-09-25 ENCOUNTER — Other Ambulatory Visit: Payer: Self-pay | Admitting: Cardiology

## 2019-09-27 ENCOUNTER — Other Ambulatory Visit: Payer: Self-pay | Admitting: Cardiology

## 2019-10-02 ENCOUNTER — Other Ambulatory Visit: Payer: Self-pay | Admitting: Cardiology

## 2019-10-08 ENCOUNTER — Ambulatory Visit: Payer: 59 | Admitting: Cardiology

## 2019-10-08 ENCOUNTER — Other Ambulatory Visit: Payer: Self-pay

## 2019-10-08 ENCOUNTER — Encounter: Payer: Self-pay | Admitting: Cardiology

## 2019-10-08 VITALS — BP 200/108 | HR 100 | Ht 73.0 in | Wt 206.0 lb

## 2019-10-08 DIAGNOSIS — Z95828 Presence of other vascular implants and grafts: Secondary | ICD-10-CM | POA: Diagnosis not present

## 2019-10-08 DIAGNOSIS — I10 Essential (primary) hypertension: Secondary | ICD-10-CM

## 2019-10-08 MED ORDER — AMLODIPINE-OLMESARTAN 10-40 MG PO TABS: 1.0000 | ORAL_TABLET | Freq: Every day | ORAL | 3 refills | Status: DC

## 2019-10-08 MED ORDER — HYDRALAZINE HCL 50 MG PO TABS: ORAL_TABLET | ORAL | 3 refills | Status: DC

## 2019-10-08 MED ORDER — CILOSTAZOL 100 MG PO TABS: 100.0000 mg | ORAL_TABLET | Freq: Two times a day (BID) | ORAL | 3 refills | Status: DC

## 2019-10-08 MED ORDER — CARVEDILOL 12.5 MG PO TABS: ORAL_TABLET | ORAL | 3 refills | Status: DC

## 2019-10-08 NOTE — Progress Notes (Signed)
Cardiology Office Note  Date: 10/08/2019   ID: Shane Faulkner, DOB November 08, 1960, MRN [id number]  PCP:  Shane Sites, MD  Cardiologist:  Shane Lesches, MD Electrophysiologist:  None   Chief Complaint  Patient presents with  . Cardiac follow-up    History of Present Illness: Shane Faulkner is a 59 y.o. male last seen in October 2019.  He presents overdue for follow-up.  He does not report any chest discomfort or unusual shortness of breath with typical ADLs.  His blood pressure was significantly elevated today, he had run out of both Azor and hydralazine recently.  At baseline, he states that his systolics have been 341-962.  He continues to follow at [facility].  I personally reviewed his ECG today which shows normal sinus rhythm.  Past Medical History:  Diagnosis Date  . AAA (abdominal aortic aneurysm) ([place])    Followed by Shane Faulkner  . Anxiety   . Essential hypertension   . GERD (gastroesophageal reflux disease)   . Headache(784.0)   . Hyperlipidemia   . Left testicular cancer ([place]) 06/2012   a. s/p L orchiectomy  . Needle phobia     Past Surgical History:  Procedure Laterality Date  . ABDOMINAL AORTIC ENDOVASCULAR STENT GRAFT N/A 01/29/2016   Procedure: ABDOMINAL AORTIC ENDOVASCULAR STENT GRAFT;  Surgeon: Shane Posner, MD;  Location: [facility];  Service: Vascular;  Laterality: N/A;  . ORCHIECTOMY  06/05/2012   Procedure: ORCHIECTOMY;  Surgeon: Shane Gallo, MD;  Location: [facility];  Service: Urology;  Laterality: Left;  Left Inguinal Orchiectomy  . TONSILLECTOMY  1968    Current Outpatient Medications  Medication Sig Dispense Refill  . amLODipine-olmesartan (AZOR) 10-40 MG tablet Take 1 tablet by mouth daily. 90 tablet 3  . aspirin 81 MG EC tablet Take 81 mg by mouth every morning. Swallow whole.    . carvedilol (COREG) 12.5 MG tablet TAKE 1 TABLET (12.5MG ) BY MOUTH TWICE DAILY. 180 tablet 3  . cilostazol (PLETAL) 100 MG tablet Take 1 tablet (100 mg total) by mouth  2 (two) times daily before a meal. 90 tablet 3  . gabapentin (NEURONTIN) 300 MG capsule Take 300 mg by mouth 3 (three) times daily.    . hydrALAZINE (APRESOLINE) 50 MG tablet TAKE (1) TABLET BY MOUTH TWICE DAILY. 180 tablet 3  . pantoprazole (PROTONIX) 40 MG tablet Take 40 mg by mouth daily.     No current facility-administered medications for this visit.   Allergies:  Other and Penicillins   ROS:   No palpitations or syncope.  Physical Exam: VS:  BP (!) 200/108   Pulse 100   Ht 6\' 1"  (1.854 m)   Wt 206 lb (93.4 kg)   SpO2 98%   BMI 27.18 kg/m , BMI Body mass index is 27.18 kg/m.  Wt Readings from Last 3 Encounters:  10/08/19 206 lb (93.4 kg)  01/29/19 197 lb (89.4 kg)  07/03/18 198 lb (89.8 kg)    General: Patient appears comfortable at rest. HEENT: Conjunctiva and lids normal, wearing a mask. Neck: Supple, no elevated JVP or carotid bruits, no thyromegaly. Lungs: Clear to auscultation, nonlabored breathing at rest. Cardiac: Regular rate and rhythm, no S3 or significant systolic murmur, no pericardial rub. Extremities: No pitting edema, distal pulses 2+.  ECG:  An ECG dated 02/22/2018 was personally reviewed today and demonstrated:  Normal sinus rhythm.  Recent Labwork:  No interval lab work for review today.  Other Studies Reviewed Today:  No interval cardiac testing  for review today.  Assessment and Plan:  1.  Essential hypertension, blood pressure significantly elevated today although he is out of both Azor and hydralazine.  Refills provided (90-day), also continue Coreg at present dose.  Keep follow-up with PCP for further adjustments in the interim, we will see him back in 1 year.  2.  History of infrarenal abdominal aortic aneurysm status post endovascular repair.  He continues to follow with Shane Faulkner.  He is asymptomatic.  Medication Adjustments/Labs and Tests Ordered: Current medicines are reviewed at length with the patient today.  Concerns regarding  medicines are outlined above.   Tests Ordered: Orders Placed This Encounter  Procedures  . EKG 12-Lead    Medication Changes: Meds ordered this encounter  Medications  . amLODipine-olmesartan (AZOR) 10-40 MG tablet    Sig: Take 1 tablet by mouth daily.    Dispense:  90 tablet    Refill:  3  . carvedilol (COREG) 12.5 MG tablet    Sig: TAKE 1 TABLET (12.5MG ) BY MOUTH TWICE DAILY.    Dispense:  180 tablet    Refill:  3  . cilostazol (PLETAL) 100 MG tablet    Sig: Take 1 tablet (100 mg total) by mouth 2 (two) times daily before a meal.    Dispense:  90 tablet    Refill:  3  . hydrALAZINE (APRESOLINE) 50 MG tablet    Sig: TAKE (1) TABLET BY MOUTH TWICE DAILY.    Dispense:  180 tablet    Refill:  3    Disposition:  Follow up 1 year in the [place] office.  Signed, Shane Sark, MD, [facility] 10/08/2019 3:06 PM    [facility] at [facility] [address], [place], [place] [address] Phone: [phone]; Fax: [phone]

## 2019-10-08 NOTE — Patient Instructions (Signed)
Medication Instructions:  Your physician recommends that you continue on your current medications as directed. Please refer to the Current Medication list given to you today.   I refilled all your cardiac medications   *If you need a refill on your cardiac medications before your next appointment, please call your pharmacy*   Lab Work: None today If you have labs (blood work) drawn today and your tests are completely normal, you will receive your results only by: Marland Kitchen MyChart Message (if you have MyChart) OR . A paper copy in the mail If you have any lab test that is abnormal or we need to change your treatment, we will call you to review the results.   Testing/Procedures: None today   Follow-Up: At [facility], you and your health needs are our priority.  As part of our continuing mission to provide you with exceptional heart care, we have created designated Provider Care Teams.  These Care Teams include your primary Cardiologist (physician) and Advanced Practice Providers (APPs -  Physician Assistants and Nurse Practitioners) who all work together to provide you with the care you need, when you need it.  We recommend signing up for the patient portal called "MyChart".  Sign up information is provided on this After Visit Summary.  MyChart is used to connect with patients for Virtual Visits (Telemedicine).  Patients are able to view lab/test results, encounter notes, upcoming appointments, etc.  Non-urgent messages can be sent to your provider as well.   To learn more about what you can do with MyChart, go to [email or web address].    Your next appointment:   12 month(s)  The format for your next appointment:   In Person  Provider:   Rozann Lesches, MD   Other Instructions None        Thank you for choosing [facility] !

## 2020-03-24 ENCOUNTER — Other Ambulatory Visit: Payer: Self-pay | Admitting: Cardiology

## 2020-05-09 ENCOUNTER — Other Ambulatory Visit: Payer: 59

## 2020-05-09 DIAGNOSIS — Z20822 Contact with and (suspected) exposure to covid-19: Secondary | ICD-10-CM

## 2020-05-14 ENCOUNTER — Other Ambulatory Visit: Payer: 59

## 2020-05-14 DIAGNOSIS — Z20822 Contact with and (suspected) exposure to covid-19: Secondary | ICD-10-CM

## 2020-05-14 LAB — NOVEL CORONAVIRUS, NAA: SARS-CoV-2, NAA: NOT DETECTED

## 2020-05-19 LAB — NOVEL CORONAVIRUS, NAA

## 2020-07-18 ENCOUNTER — Other Ambulatory Visit: Payer: Self-pay | Admitting: Cardiology

## 2020-09-30 ENCOUNTER — Other Ambulatory Visit: Payer: Self-pay | Admitting: Cardiology

## 2020-10-28 ENCOUNTER — Other Ambulatory Visit: Payer: Self-pay | Admitting: Cardiology

## 2020-11-27 ENCOUNTER — Other Ambulatory Visit: Payer: Self-pay | Admitting: Cardiology

## 2020-12-01 ENCOUNTER — Other Ambulatory Visit: Payer: Self-pay | Admitting: Cardiology

## 2020-12-18 ENCOUNTER — Other Ambulatory Visit: Payer: Self-pay | Admitting: Cardiology

## 2020-12-30 ENCOUNTER — Other Ambulatory Visit: Payer: Self-pay | Admitting: Cardiology

## 2021-01-19 ENCOUNTER — Telehealth: Payer: Self-pay | Admitting: Cardiology

## 2021-01-19 NOTE — Telephone Encounter (Signed)
   1. Which medications need to be refilled? (please list name of each medication and dose if known)   2. Which pharmacy/location (including street and city if local pharmacy) is medication to be sent to?  3. Do they need a 30 day or 90 day supply? 0   Error - patient cannot figure out which medication he needs. He is going to contact [employer].

## 2021-01-21 ENCOUNTER — Other Ambulatory Visit: Payer: Self-pay

## 2021-01-21 ENCOUNTER — Other Ambulatory Visit: Payer: Self-pay | Admitting: Cardiology

## 2021-01-21 MED ORDER — CILOSTAZOL 50 MG PO TABS: ORAL_TABLET | ORAL | 6 refills | Status: DC

## 2021-01-21 NOTE — Telephone Encounter (Signed)
Refilled Pletal 50 mg ,2 tabs BID #120 to [place]

## 2021-01-22 ENCOUNTER — Ambulatory Visit: Payer: 59 | Admitting: Cardiology

## 2021-01-28 ENCOUNTER — Other Ambulatory Visit: Payer: Self-pay | Admitting: Cardiology

## 2021-02-01 ENCOUNTER — Other Ambulatory Visit: Payer: Self-pay | Admitting: Cardiology

## 2021-03-10 ENCOUNTER — Encounter: Payer: Self-pay | Admitting: Cardiology

## 2021-03-10 ENCOUNTER — Encounter: Payer: Self-pay | Admitting: *Deleted

## 2021-03-10 ENCOUNTER — Ambulatory Visit: Payer: 59 | Admitting: Cardiology

## 2021-03-10 VITALS — BP 154/90 | HR 89 | Ht 73.0 in | Wt 223.4 lb

## 2021-03-10 DIAGNOSIS — I10 Essential (primary) hypertension: Secondary | ICD-10-CM

## 2021-03-10 DIAGNOSIS — I739 Peripheral vascular disease, unspecified: Secondary | ICD-10-CM

## 2021-03-10 DIAGNOSIS — Z95828 Presence of other vascular implants and grafts: Secondary | ICD-10-CM | POA: Diagnosis not present

## 2021-03-10 MED ORDER — CARVEDILOL 25 MG PO TABS: 25.0000 mg | ORAL_TABLET | Freq: Two times a day (BID) | ORAL | 3 refills | Status: DC

## 2021-03-10 NOTE — Patient Instructions (Addendum)
Medication Instructions:  Your physician has recommended you make the following change in your medication:  Increase carvedilol to 25 mg twice daily Continue other medications the same  Labwork: none  Testing/Procedures: none  Follow-Up: Your physician recommends that you schedule a follow-up appointment in: 6 months  Any Other Special Instructions Will Be Listed Below (If Applicable). You have been referred to Vein and Vascular (Dr. Donnetta Hutching)  If you need a refill on your cardiac medications before your next appointment, please call your pharmacy.

## 2021-03-10 NOTE — Progress Notes (Signed)
Cardiology Office Note  Date: 03/10/2021   ID: Shane Faulkner, DOB 1961/04/23, MRN [id number]  PCP:  Sharilyn Sites, MD  Cardiologist:  Rozann Lesches, MD Electrophysiologist:  None   Chief Complaint  Patient presents with   Cardiac follow-up    History of Present Illness: Shane Faulkner is a 60 y.o. male last seen in June 2021.  He is here today with his wife for a follow-up visit.  He tells me that he has had worsening claudication symptoms over the last year.  This is compounded by what sounds more like neuropathy and perhaps some sciatic pain.  He has known, significant PAD however, his last ABIs from 2020 showed left greater than right disease.  He has been on Pletal and not had follow-up with Dr. Donnetta Hutching since 2020.  He also has a history of AAA status post endovascular repair.  I reviewed his medications which are noted below.  He reports compliance with antihypertensives.  We discussed increasing his Coreg dose.  He is also now on Lovaza, requesting interval lab work from Dr. Hilma Favors for review.  Patient recalls his triglycerides being high.  I personally reviewed his ECG today which shows sinus rhythm with prolonged PR interval.  Past Medical History:  Diagnosis Date   AAA (abdominal aortic aneurysm)    Followed by Dr. Donnetta Hutching   Anxiety    Essential hypertension    GERD (gastroesophageal reflux disease)    Headache(784.0)    Hyperlipidemia    Left testicular cancer ([place]) 06/2012   a. s/p L orchiectomy   Needle phobia     Past Surgical History:  Procedure Laterality Date   ABDOMINAL AORTIC ENDOVASCULAR STENT GRAFT N/A 01/29/2016   Procedure: ABDOMINAL AORTIC ENDOVASCULAR STENT GRAFT;  Surgeon: [name], MD;  Location: [facility];  Service: Vascular;  Laterality: N/A;   ORCHIECTOMY  06/05/2012   Procedure: Ann Maki;  Surgeon: Franchot Gallo, MD;  Location: [facility];  Service: Urology;  Laterality: Left;  Left Inguinal Orchiectomy   TONSILLECTOMY  1968    Current  Outpatient Medications  Medication Sig Dispense Refill   amLODipine-olmesartan (AZOR) 10-40 MG tablet TAKE ONE TABLET BY MOUTH ONCE DAILY. 30 tablet 2   aspirin 81 MG EC tablet Take 81 mg by mouth every morning. Swallow whole.     carvedilol (COREG) 25 MG tablet Take 1 tablet (25 mg total) by mouth 2 (two) times daily. 180 tablet 3   cilostazol (PLETAL) 50 MG tablet TAKE (2) TABLETS BY MOUTH TWICE DAILY. 120 tablet 6   gabapentin (NEURONTIN) 300 MG capsule Take 300 mg by mouth 3 (three) times daily.     hydrALAZINE (APRESOLINE) 50 MG tablet TAKE (1) TABLET BY MOUTH TWICE DAILY. 180 tablet 0   omega-3 acid ethyl esters (LOVAZA) 1 g capsule Take by mouth 2 (two) times daily. 2 capsules twice daily.     pantoprazole (PROTONIX) 40 MG tablet Take 40 mg by mouth daily.     No current facility-administered medications for this visit.   Allergies:  Other and Penicillins   ROS: No chest pain, no palpitations or syncope.  Physical Exam: VS:  BP (!) 154/90   Pulse 89   Ht 6\' 1"  (1.854 m)   Wt 223 lb 6.4 oz (101.3 kg)   SpO2 97%   BMI 29.47 kg/m , BMI Body mass index is 29.47 kg/m.  Wt Readings from Last 3 Encounters:  03/10/21 223 lb 6.4 oz (101.3 kg)  10/08/19 206 lb (93.4  kg)  01/29/19 197 lb (89.4 kg)    General: Patient appears comfortable at rest. HEENT: Conjunctiva and lids normal, wearing a mask. Neck: Supple, no elevated JVP or carotid bruits, no thyromegaly. Lungs: Clear to auscultation, nonlabored breathing at rest. Cardiac: Regular rate and rhythm, no S3 or significant systolic murmur, no pericardial rub. Abdomen: Soft, nontender, bowel sounds present. Extremities: No pitting edema, decreased DPs bilaterally.  No distal ulcerations or wounds.  Decreased capillary refill.  ECG:  An ECG dated 10/08/2019 was personally reviewed today and demonstrated:  Sinus rhythm.  Recent Labwork:  No interval lab work for review today.  Other Studies Reviewed Today:  ABIs  01/29/2019: Summary:  Right: Resting right ankle-brachial index indicates moderate right lower  extremity arterial disease. The right toe-brachial index is abnormal.   Left: Resting left ankle-brachial index indicates severe left lower  extremity arterial disease. The left toe-brachial index is abnormal.   Assessment and Plan:  1.  Essential hypertension.  He is currently on Azor, Coreg, and hydralazine.  Plan to increase Coreg to 25 mg twice daily.  We also discussed weight loss.  2.  Infrarenal abdominal aortic aneurysm status post endovascular repair.  He is overdue for follow-up with Dr. Donnetta Hutching which will be arranged.  3.  PAD with progressive claudication.  ABIs from 2020 noted above suggesting a left greater than right disease.  He will be reevaluated by Dr. Donnetta Hutching.  Currently on Pletal and aspirin.  4.  Mixed hyperlipidemia, started on Lovaza by PCP in the interim.  Requesting lab work.  Medication Adjustments/Labs and Tests Ordered: Current medicines are reviewed at length with the patient today.  Concerns regarding medicines are outlined above.   Tests Ordered: Orders Placed This Encounter  Procedures   Ambulatory referral to Vascular Surgery   EKG 12-Lead     Medication Changes: Meds ordered this encounter  Medications   carvedilol (COREG) 25 MG tablet    Sig: Take 1 tablet (25 mg total) by mouth 2 (two) times daily.    Dispense:  180 tablet    Refill:  3    03/10/2021 dose increase     Disposition:  Follow up  6 months.  Signed, Satira Sark, MD, [facility] 03/10/2021 4:06 PM    [facility] at [place], [place], [place] [address] Phone: [phone]; Fax: [phone]

## 2021-04-27 ENCOUNTER — Other Ambulatory Visit (HOSPITAL_COMMUNITY): Payer: Self-pay | Admitting: Vascular Surgery

## 2021-04-27 DIAGNOSIS — I714 Abdominal aortic aneurysm, without rupture, unspecified: Secondary | ICD-10-CM

## 2021-04-27 DIAGNOSIS — I739 Peripheral vascular disease, unspecified: Secondary | ICD-10-CM

## 2021-04-27 DIAGNOSIS — Z8679 Personal history of other diseases of the circulatory system: Secondary | ICD-10-CM

## 2021-04-27 DIAGNOSIS — Z9889 Other specified postprocedural states: Secondary | ICD-10-CM

## 2021-04-28 ENCOUNTER — Encounter (HOSPITAL_COMMUNITY): Payer: 59

## 2021-04-28 ENCOUNTER — Other Ambulatory Visit: Payer: 59

## 2021-04-28 ENCOUNTER — Ambulatory Visit: Payer: 59 | Admitting: Vascular Surgery

## 2021-05-14 ENCOUNTER — Other Ambulatory Visit: Payer: Self-pay | Admitting: Cardiology

## 2021-05-31 ENCOUNTER — Other Ambulatory Visit: Payer: Self-pay | Admitting: Cardiology

## 2021-06-16 ENCOUNTER — Ambulatory Visit (INDEPENDENT_AMBULATORY_CARE_PROVIDER_SITE_OTHER): Payer: 59

## 2021-06-16 ENCOUNTER — Ambulatory Visit (INDEPENDENT_AMBULATORY_CARE_PROVIDER_SITE_OTHER): Payer: 59 | Admitting: Vascular Surgery

## 2021-06-16 ENCOUNTER — Encounter: Payer: Self-pay | Admitting: Vascular Surgery

## 2021-06-16 ENCOUNTER — Other Ambulatory Visit: Payer: Self-pay

## 2021-06-16 DIAGNOSIS — Z8679 Personal history of other diseases of the circulatory system: Secondary | ICD-10-CM

## 2021-06-16 DIAGNOSIS — I739 Peripheral vascular disease, unspecified: Secondary | ICD-10-CM

## 2021-06-16 DIAGNOSIS — I714 Abdominal aortic aneurysm, without rupture, unspecified: Secondary | ICD-10-CM

## 2021-06-16 DIAGNOSIS — Z9889 Other specified postprocedural states: Secondary | ICD-10-CM | POA: Diagnosis not present

## 2021-06-16 NOTE — Progress Notes (Signed)
[facility] [place]  Patient name: Shane Faulkner MRN: [id number] DOB: 09/04/1960 Sex: male  REASON FOR VISIT: Follow-up peripheral vascular disease and stent graft repair abdominal aortic aneurysm  HPI: Shane Faulkner is a 61 y.o. male here today for follow-up.  He is here today with his wife.  He reports stable lower extremity claudication.  He did have some improvement with cilostazol and continues this.  His main complaint continues to be peripheral neuropathy which is quite bothersome to him.  He has no arterial rest pain and no tissue loss.  Past Medical History:  Diagnosis Date   AAA (abdominal aortic aneurysm)    Followed by Dr. Donnetta Hutching   Anxiety    Essential hypertension    GERD (gastroesophageal reflux disease)    Headache(784.0)    Hyperlipidemia    Left testicular cancer ([place]) 06/2012   a. s/p L orchiectomy   Needle phobia     Family History  Problem Relation Age of Onset   AAA (abdominal aortic aneurysm) Father    Hyperlipidemia Father     SOCIAL HISTORY: Social History   Tobacco Use   Smoking status: Every Day    Packs/day: 1.00    Years: 32.00    Pack years: 32.00    Types: Cigarettes    Start date: 12/08/1978   Smokeless tobacco: Never  Substance Use Topics   Alcohol use: Yes    Alcohol/week: 24.0 standard drinks    Types: 24 Cans of beer per week    Allergies  Allergen Reactions   Other Anaphylaxis    Cashews   Penicillins Anaphylaxis    Has patient had a PCN reaction causing immediate rash, facial/tongue/throat swelling, SOB or lightheadedness with hypotension: Yes Has patient had a PCN reaction causing severe rash involving mucus membranes or skin necrosis: No Has patient had a PCN reaction that required hospitalization No Has patient had a PCN reaction occurring within the last 10 years: No If all of the above answers are "NO", then may proceed with Cephalosporin use.      Current Outpatient Medications  Medication Sig Dispense Refill   amLODipine-olmesartan (AZOR) 10-40 MG tablet TAKE ONE TABLET BY MOUTH ONCE DAILY. 30 tablet 6   aspirin 81 MG EC tablet Take 81 mg by mouth every morning. Swallow whole.     carvedilol (COREG) 25 MG tablet Take 1 tablet (25 mg total) by mouth 2 (two) times daily. 180 tablet 3   cilostazol (PLETAL) 50 MG tablet TAKE (2) TABLETS BY MOUTH TWICE DAILY. 120 tablet 6   gabapentin (NEURONTIN) 300 MG capsule Take 300 mg by mouth 3 (three) times daily.     hydrALAZINE (APRESOLINE) 50 MG tablet TAKE (1) TABLET BY MOUTH TWICE DAILY. 180 tablet 1   omega-3 acid ethyl esters (LOVAZA) 1 g capsule Take by mouth 2 (two) times daily. 2 capsules twice daily.     pantoprazole (PROTONIX) 40 MG tablet Take 40 mg by mouth daily.     No current facility-administered medications for this visit.    REVIEW OF SYSTEMS:  [X]  denotes positive finding, [ ]  denotes negative finding Cardiac  Comments:  Chest pain or chest pressure:    Shortness of breath upon exertion:    Short of breath when lying flat:    Irregular heart rhythm:        Vascular    Pain in calf, thigh, or hip brought on by ambulation:    Pain in feet at night  that wakes you up from your sleep:     Blood clot in your veins:    Leg swelling:           PHYSICAL EXAM: There were no vitals filed for this visit.  GENERAL: The patient is a well-nourished male, in no acute distress. The vital signs are documented above. CARDIOVASCULAR: Palpable radial pulses.  I do not feel an aneurysm or expansile mass in his abdomen.  Absent pedal pulses bilaterally. PULMONARY: There is good air exchange  MUSCULOSKELETAL: There are no major deformities or cyanosis. NEUROLOGIC: No focal weakness or paresthesias are detected. SKIN: There are no ulcers or rashes noted. PSYCHIATRIC: The patient has a normal affect.  DATA:  ABI today is 0.71 on the right and 0.47 on the left which is unchanged  since September 2020  Ultrasound of his aortic aneurysm stent graft repair was noted to have an expansion in his native sac to 5.97 cm maximal diameter.  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  He does appear to have stable peripheral vascular occlusive disease and is not limited by his claudication.  Main lower extremity limitation is neuropathy.  Regarding his aortic stent graft repair, I did explain that it is quite concerning that he has had enlargement in his aneurysm sac by duplex.  His most recent duplex was October 2019 with maximal sac size of 4.7 cm.  CT scan from 2017 revealed maximal diameter of his aortic sac at 5.1 cm.  I have recommended CT angiogram of his abdomen and pelvis for follow-up of this.  He has severe needle phobia.  I discussed this with staff at [facility].  I spoke with Anderson Malta who understands the needle phobia issue and will assist in ensuring this is handled as well as possible.  He will see me back in the office following the CT.    [name], MD FACS [facility] [phone]  Note: Portions of this report may have been transcribed using voice recognition software.  Every effort has been made to ensure accuracy; however, inadvertent computerized transcription errors may still be present.

## 2021-06-18 ENCOUNTER — Other Ambulatory Visit: Payer: Self-pay

## 2021-06-18 ENCOUNTER — Other Ambulatory Visit: Payer: Self-pay | Admitting: Physician Assistant

## 2021-06-18 DIAGNOSIS — I739 Peripheral vascular disease, unspecified: Secondary | ICD-10-CM

## 2021-06-18 DIAGNOSIS — I714 Abdominal aortic aneurysm, without rupture, unspecified: Secondary | ICD-10-CM

## 2021-06-18 MED ORDER — LORAZEPAM 0.5 MG PO TABS: 0.5000 mg | ORAL_TABLET | Freq: Two times a day (BID) | ORAL | 0 refills | Status: DC

## 2021-06-21 ENCOUNTER — Other Ambulatory Visit: Payer: Self-pay

## 2021-06-21 DIAGNOSIS — I739 Peripheral vascular disease, unspecified: Secondary | ICD-10-CM

## 2021-06-21 DIAGNOSIS — I714 Abdominal aortic aneurysm, without rupture, unspecified: Secondary | ICD-10-CM

## 2021-06-23 ENCOUNTER — Other Ambulatory Visit: Payer: Self-pay

## 2021-06-23 ENCOUNTER — Telehealth: Payer: Self-pay | Admitting: Vascular Surgery

## 2021-06-23 ENCOUNTER — Ambulatory Visit (HOSPITAL_COMMUNITY)
Admission: RE | Admit: 2021-06-23 | Discharge: 2021-06-23 | Disposition: A | Discharge status: Home / Self Care | Payer: 59 | Source: Ambulatory Visit | Attending: Vascular Surgery | Admitting: Vascular Surgery

## 2021-06-23 ENCOUNTER — Telehealth: Payer: Self-pay

## 2021-06-23 DIAGNOSIS — I714 Abdominal aortic aneurysm, without rupture, unspecified: Secondary | ICD-10-CM | POA: Insufficient documentation

## 2021-06-23 DIAGNOSIS — I739 Peripheral vascular disease, unspecified: Secondary | ICD-10-CM | POA: Diagnosis not present

## 2021-06-23 LAB — POCT I-STAT CREATININE: Creatinine, Ser: 1.3 mg/dL — ABNORMAL HIGH (ref 0.61–1.24)

## 2021-06-23 MED ORDER — IOHEXOL 350 MG/ML SOLN
100.0000 mL | Freq: Once | INTRAVENOUS | Status: AC | PRN
  Administered 2021-06-23: 100 mL via INTRAVENOUS

## 2021-06-23 NOTE — Telephone Encounter (Signed)
Patient underwent CT scan today and I have called him with the results.  CT scan showed there have been no change in his maximal sac size since his last CT scan in 2017.  I explained that we had overestimated his sac size by ultrasound.  I would recommend a repeat ultrasound in 2 years for follow-up of his aneurysm.  He does have severe mural thrombus throughout the left limb of his aorta iliac stent graft for aneurysm.  This was felt to be occluded by duplex.  I suspect that he only has a trickle of flow through this.  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  He had very small external iliac arteries prior to stent graft placement.  His external iliac is occluded to just above the inguinal ligament.  I reviewed his films and discussed this with Shane Faulkner.  He could potentially have attempt at getting a wire across his external iliac and relining his left iliac limb and external iliac artery with stents.  We agree this would have a moderate chance of initial success and certainly would have a high risk for failure due to his small iliac size and the need to stent down to just above his inguinal ligament.  He could have femoral to femoral bypass but would be concerned about durability of this due to his very small inflow right iliac system.  I discussed these options with Shane Faulkner as well.  He is comfortable with his current level of claudication therefore would recommend observation only.  He will notify me should he develop any worsening symptoms.  We will see him in 2 years with repeat ultrasound

## 2021-06-23 NOTE — Telephone Encounter (Signed)
Call report received from [place] at [facility] regarding pt's CTA abd/pelvis. MD has been made aware results are in.

## 2021-06-30 ENCOUNTER — Ambulatory Visit: Payer: 59 | Admitting: Vascular Surgery

## 2021-07-28 ENCOUNTER — Ambulatory Visit: Payer: 59 | Admitting: Vascular Surgery

## 2021-09-01 ENCOUNTER — Other Ambulatory Visit: Payer: Self-pay | Admitting: Cardiology

## 2021-10-05 ENCOUNTER — Other Ambulatory Visit: Payer: Self-pay | Admitting: Cardiology

## 2021-11-01 ENCOUNTER — Other Ambulatory Visit: Payer: Self-pay | Admitting: Cardiology

## 2021-11-11 ENCOUNTER — Other Ambulatory Visit: Payer: Self-pay | Admitting: Cardiology

## 2021-12-09 ENCOUNTER — Other Ambulatory Visit: Payer: Self-pay | Admitting: Cardiology

## 2022-02-24 ENCOUNTER — Encounter: Payer: Self-pay | Admitting: Cardiology

## 2022-02-24 ENCOUNTER — Ambulatory Visit: Payer: 59 | Attending: Cardiology | Admitting: Cardiology

## 2022-02-24 VITALS — BP 158/82 | HR 72 | Ht 73.0 in | Wt 215.4 lb

## 2022-02-24 DIAGNOSIS — I10 Essential (primary) hypertension: Secondary | ICD-10-CM | POA: Diagnosis not present

## 2022-02-24 DIAGNOSIS — I739 Peripheral vascular disease, unspecified: Secondary | ICD-10-CM

## 2022-02-24 NOTE — Patient Instructions (Signed)

## 2022-02-24 NOTE — Progress Notes (Signed)
Cardiology Office Note  Date: 02/24/2022   ID: Shane Faulkner, DOB [birth year], MRN [id number]  PCP:  Sharilyn Sites, MD  Cardiologist:  Rozann Lesches, MD Electrophysiologist:  None   Chief Complaint  Patient presents with   Cardiac follow-up    History of Present Illness: Shane Faulkner is a 61 y.o. male last seen in November 2022.  He is here today with his wife for a follow-up visit.  Reports no progressive claudication, no exertional chest pain and stable NYHA class II dyspnea.  Visit noted with Dr. Donnetta Hutching back in February.  He did undergo ABIs and abdominal/pelvic CTA.  I reviewed the results and recommendations with plan for conservative follow-up for now.  He otherwise remains on statin therapy and aspirin.  I personally reviewed his ECG today which is normal.  Medications also reviewed.  Past Medical History:  Diagnosis Date   AAA (abdominal aortic aneurysm) ([place])    Followed by Dr. Donnetta Hutching   Anxiety    Essential hypertension    GERD (gastroesophageal reflux disease)    Headache(784.0)    Hyperlipidemia    Left testicular cancer ([place]) 06/2012   a. s/p L orchiectomy   Needle phobia     Past Surgical History:  Procedure Laterality Date   ABDOMINAL AORTIC ENDOVASCULAR STENT GRAFT N/A 01/29/2016   Procedure: ABDOMINAL AORTIC ENDOVASCULAR STENT GRAFT;  Surgeon: [name], MD;  Location: [facility];  Service: Vascular;  Laterality: N/A;   ORCHIECTOMY  06/05/2012   Procedure: Ann Maki;  Surgeon: Franchot Gallo, MD;  Location: [facility];  Service: Urology;  Laterality: Left;  Left Inguinal Orchiectomy   TONSILLECTOMY  1968    Current Outpatient Medications  Medication Sig Dispense Refill   amLODipine-olmesartan (AZOR) 10-40 MG tablet TAKE ONE TABLET BY MOUTH ONCE DAILY. 90 tablet 0   aspirin 81 MG EC tablet Take 81 mg by mouth every morning. Swallow whole.     carvedilol (COREG) 25 MG tablet Take 1 tablet (25 mg total) by mouth 2 (two) times daily. 180 tablet 3    hydrALAZINE (APRESOLINE) 50 MG tablet TAKE (1) TABLET BY MOUTH TWICE DAILY. 180 tablet 1   omega-3 acid ethyl esters (LOVAZA) 1 g capsule Take 2 g by mouth 2 (two) times daily. 2 capsules twice daily.     pantoprazole (PROTONIX) 40 MG tablet Take 40 mg by mouth daily.     pregabalin (LYRICA) 75 MG capsule Take 75 mg by mouth 3 (three) times daily.     rosuvastatin (CRESTOR) 20 MG tablet Take 20 mg by mouth daily.     No current facility-administered medications for this visit.   Allergies:  Other and Penicillins   ROS:  No syncope.  Physical Exam: VS:  BP (!) 158/82   Pulse 72   Ht '6\' 1"'$  (1.854 m)   Wt 215 lb 6.4 oz (97.7 kg)   SpO2 97%   BMI 28.42 kg/m , BMI Body mass index is 28.42 kg/m.  Wt Readings from Last 3 Encounters:  02/24/22 215 lb 6.4 oz (97.7 kg)  06/16/21 216 lb (98 kg)  03/10/21 223 lb 6.4 oz (101.3 kg)    General: Patient appears comfortable at rest. HEENT: Conjunctiva and lids normal. Neck: Supple, no elevated JVP or carotid bruits. Lungs: Clear to auscultation, nonlabored breathing at rest. Cardiac: Regular rate and rhythm, no S3 or significant systolic murmur. Extremities: No pitting edema.  Decreased DPs, no ulcerations.  ECG:  An ECG dated 03/10/2021 was personally reviewed  today and demonstrated:  Sinus rhythm with prolonged PR interval.  Recent Labwork: 06/23/2021: Creatinine, Ser 1.30   Other Studies Reviewed Today:  ABIs 06/16/2021: Summary:  Right: Resting right ankle-brachial index indicates moderate right lower  extremity arterial disease. The right toe-brachial index is abnormal.   Left: Resting left ankle-brachial index indicates severe left lower  extremity arterial disease. The left toe-brachial index is abnormal.   Abdominal and pelvic CTA 06/23/2021: IMPRESSION: VASCULAR   1. Unchanged abdominal aortic aneurysm status post endograft repair. Interval development of severe in stent stenosis throughout the left iliac limb and  associated occlusion of the left external iliac artery. There is distal reconstitution at the level of the inguinal ligament. No evidence of aneurysm sac enlargement or endoleak. 2.  Aortic Atherosclerosis (ICD10-I70.0).   NON-VASCULAR   1. Similar appearing ischemic changes and atrophy of the inferior pole of the left kidney due to endograft exclusion of the left accessory/inferior renal artery. 2. Similar appearing flash filling hemangioma the anterior aspect of the left lobe of the liver. 3. Diverticulosis. 4. Prostatomegaly.  Assessment and Plan:  1.  Essential hypertension.  Reports peak systolic at home usually in the 140s and typically last.  Blood pressure higher today.  He reports compliance with Coreg, hydralazine, and Azor.  I have asked him to continue to track blood pressure, if systolic staying in the 459X to 150s would increase hydralazine to 3 times daily dosing.  Keep follow-up with Dr. Hilma Favors.  2.  PAD as outlined above.  He is now following with Dr. Donnetta Hutching.  Continue aspirin and Crestor.  Medication Adjustments/Labs and Tests Ordered: Current medicines are reviewed at length with the patient today.  Concerns regarding medicines are outlined above.   Tests Ordered: Orders Placed This Encounter  Procedures   EKG 12-Lead    Medication Changes: No orders of the defined types were placed in this encounter.   Disposition:  Follow up  1 year.  Signed, Satira Sark, MD, [facility] 02/24/2022 2:30 PM    [facility] [place], [place], [place] [address] Phone: [phone]; Fax: [phone]

## 2022-03-07 ENCOUNTER — Other Ambulatory Visit: Payer: Self-pay | Admitting: Cardiology

## 2022-04-12 ENCOUNTER — Other Ambulatory Visit: Payer: Self-pay | Admitting: Cardiology

## 2022-07-25 ENCOUNTER — Other Ambulatory Visit: Payer: Self-pay | Admitting: Cardiology

## 2022-11-22 ENCOUNTER — Other Ambulatory Visit: Payer: Self-pay | Admitting: Cardiology

## 2023-02-27 ENCOUNTER — Other Ambulatory Visit: Payer: Self-pay | Admitting: Cardiology

## 2023-03-13 ENCOUNTER — Other Ambulatory Visit: Payer: Self-pay | Admitting: Cardiology

## 2023-04-08 ENCOUNTER — Other Ambulatory Visit: Payer: Self-pay | Admitting: Cardiology

## 2023-05-04 ENCOUNTER — Other Ambulatory Visit: Payer: Self-pay | Admitting: Cardiology

## 2023-06-13 ENCOUNTER — Encounter: Payer: Self-pay | Admitting: Nurse Practitioner

## 2023-06-13 ENCOUNTER — Ambulatory Visit: Payer: 59 | Attending: Nurse Practitioner | Admitting: Nurse Practitioner

## 2023-06-13 VITALS — BP 130/78 | HR 78 | Ht 73.0 in | Wt 212.6 lb

## 2023-06-13 DIAGNOSIS — Z8679 Personal history of other diseases of the circulatory system: Secondary | ICD-10-CM

## 2023-06-13 DIAGNOSIS — I10 Essential (primary) hypertension: Secondary | ICD-10-CM | POA: Diagnosis not present

## 2023-06-13 DIAGNOSIS — E785 Hyperlipidemia, unspecified: Secondary | ICD-10-CM | POA: Diagnosis not present

## 2023-06-13 DIAGNOSIS — Z9889 Other specified postprocedural states: Secondary | ICD-10-CM | POA: Diagnosis not present

## 2023-06-13 DIAGNOSIS — Z720 Tobacco use: Secondary | ICD-10-CM

## 2023-06-13 DIAGNOSIS — I739 Peripheral vascular disease, unspecified: Secondary | ICD-10-CM

## 2023-06-13 MED ORDER — CARVEDILOL 25 MG PO TABS: 25.0000 mg | ORAL_TABLET | Freq: Two times a day (BID) | ORAL | 3 refills | Status: AC

## 2023-06-13 MED ORDER — HYDRALAZINE HCL 50 MG PO TABS: ORAL_TABLET | ORAL | 3 refills | Status: AC

## 2023-06-13 MED ORDER — AMLODIPINE-OLMESARTAN 10-40 MG PO TABS: 1.0000 | ORAL_TABLET | Freq: Every day | ORAL | 11 refills | Status: AC

## 2023-06-13 NOTE — Patient Instructions (Addendum)

## 2023-06-13 NOTE — Progress Notes (Unsigned)
Cardiology Office Note:  .   Date: 06/13/2023 ID:  Georganna Skeans, DOB July 05, 1960, MRN [id number] PCP: Assunta Found, MD  [facility] Providers Cardiologist:  Nona Dell, MD    History of Present Illness: Shane Faulkner   LARAMIE MEISSNER is a 63 y.o. male with a PMH of AAA, s/p endograft repair (followed by Dr. Arbie Cookey), PAD, hypertension, hyperlipidemia, GERD, past history of left testicular cancer, s/p L orchiectomy, who presents today for overdue 1 year follow-up.   Last seen by Dr. Diona Browner on February 24, 2022.  He was overall doing well, blood pressure was not at goal.  Patient continued to follow with Dr. Arbie Cookey.  Today he presents for overdue 1 year follow-up.  He states he is doing well.  Denies any acute cardiac plaints or concerns. Denies any chest pain, shortness of breath, palpitations, syncope, presyncope, dizziness, orthopnea, PND, swelling or significant weight changes, acute bleeding, or claudication.  He tells me he will be seeing his vascular specialist soon for regular follow-up.  ROS: Negative.  See HPI. SH: Smokes 1.5 packs/day. Occasional alcohol use.   Studies Reviewed: Shane Faulkner    EKG: EKG Interpretation Date/Time:  Tuesday June 13 2023 13:42:41 EST Ventricular Rate:  78 PR Interval:  180 QRS Duration:  96 QT Interval:  394 QTC Calculation: 449 R Axis:   79  Text Interpretation: Normal sinus rhythm Normal ECG When compared with ECG of 04-Jun-2012 14:29, No significant change was found Confirmed by Sharlene Dory [phone]) on 06/13/2023 1:48:52 PM   ABI's 06/2021:  Summary:  Right: Resting right ankle-brachial index indicates moderate right lower  extremity arterial disease. The right toe-brachial index is abnormal.   Left: Resting left ankle-brachial index indicates severe left lower  extremity arterial disease. The left toe-brachial index is abnormal.  Vascular ultrasound EVAR Duplex 06/2021:  Summary:  Abdominal Aorta: Patent endovascular aneurysm repair  with no evidence of  endoleak. Previous diameter measurement was 4.72 x 4.63 cm obtained on  02/09/18. Right and left limbs were not visualized on the previous exam.    Monophasic waveforms in the bilateral CIA and EIA. Left proximal and mid  EIA appear occluded.   Physical Exam:   VS:  BP 130/78   Pulse 78   Ht 6\' 1"  (1.854 m)   Wt 212 lb 9.6 oz (96.4 kg)   SpO2 98%   BMI 28.05 kg/m    Wt Readings from Last 3 Encounters:  06/13/23 212 lb 9.6 oz (96.4 kg)  02/24/22 215 lb 6.4 oz (97.7 kg)  06/16/21 216 lb (98 kg)    GEN: Well nourished, well developed in no acute distress NECK: No JVD; No carotid bruits CARDIAC: S1/S2, RRR, no murmurs, rubs, gallops RESPIRATORY:  Clear to auscultation without rales, wheezing or rhonchi  ABDOMEN: Soft, non-tender, non-distended EXTREMITIES:  No edema; No deformity   ASSESSMENT AND PLAN: .    AAA, s/p endograft repair  He has been closely followed by vascular surgery, formally followed by Dr. Arbie Cookey.  He says he is due for his regular follow-up soon.  Denies any concerning signs or symptoms.  Continue current medication regimen.  Care and ED precautions discussed.  2.  PAD, hyperlipidemia Denies any signs or symptoms.  Continue aspirin.  No longer taking Crestor for some unknown reason.  Will request most recent labs from PCP's office.  Continue follow-up with [facility]. Heart healthy diet and regular cardiovascular exercise encouraged.   3.  Hypertension Blood pressure borderline elevated today.  He tells  me blood pressure is well-controlled at home.  No medication changes at this time.  Continue follow-up with PCP. Heart healthy diet and regular cardiovascular exercise encouraged.   4. Tobacco abuse Smoking cessation encouraged and discussed.    Dispo: Will provide refills per his request.  Follow-up with Dr. Diona Browner or APP in 1 year or sooner if any changes.  Signed, Sharlene Dory, NP

## 2023-06-14 ENCOUNTER — Encounter: Payer: Self-pay | Admitting: Family Medicine

## 2024-03-18 ENCOUNTER — Other Ambulatory Visit: Payer: Self-pay

## 2024-03-18 DIAGNOSIS — I714 Abdominal aortic aneurysm, without rupture, unspecified: Secondary | ICD-10-CM

## 2024-04-10 NOTE — Progress Notes (Deleted)
 Patient ID: Shane Faulkner, male   DOB: [birth year], 63 y.o.   MRN: [id number]  Reason for Consult: No chief complaint on file.   Referred by Marvine Rush, MD  Subjective:     HPI Shane Faulkner is a 63 y.o. male presenting for follow-up.  He was last seen by Dr. Oris in February 2023.  He has history of EVAR for AAA that was done in 2017.  He had a CT scan done in 2023 due to possible growth although the CT scan was noted to be stable and the ultrasound overestimated the sac size.  He also has a known left external iliac occlusion with severe mural thrombus in the left limb of the graft.  At that time he had stable claudication and was not interested in any intervention. Today he reports ***  Past Medical History:  Diagnosis Date   AAA (abdominal aortic aneurysm)    Followed by Dr. Oris   Anxiety    Essential hypertension    GERD (gastroesophageal reflux disease)    Headache(784.0)    Hyperlipidemia    Left testicular cancer ([facility]) 06/2012   a. s/p L orchiectomy   Needle phobia    Family History  Problem Relation Age of Onset   AAA (abdominal aortic aneurysm) Father    Hyperlipidemia Father    Past Surgical History:  Procedure Laterality Date   ABDOMINAL AORTIC ENDOVASCULAR STENT GRAFT N/A 01/29/2016   Procedure: ABDOMINAL AORTIC ENDOVASCULAR STENT GRAFT;  Surgeon: Krystal JULIANNA Oris, MD;  Location: [facility];  Service: Vascular;  Laterality: N/A;   ORCHIECTOMY  06/05/2012   Procedure: CASSAUNDRA;  Surgeon: Garnette Shack, MD;  Location: [facility];  Service: Urology;  Laterality: Left;  Left Inguinal Orchiectomy   TONSILLECTOMY  1968    Short Social History:  Social History   Tobacco Use   Smoking status: Every Day    Current packs/day: 1.00    Average packs/day: 1 pack/day for 45.3 years (45.3 ttl pk-yrs)    Types: Cigarettes    Start date: 12/08/1978   Smokeless tobacco: Never  Substance Use Topics   Alcohol use: Yes    Alcohol/week: 24.0 standard drinks of alcohol     Types: 24 Cans of beer per week    Allergies  Allergen Reactions   Other Anaphylaxis    Cashews   Penicillins Anaphylaxis    Has patient had a PCN reaction causing immediate rash, facial/tongue/throat swelling, SOB or lightheadedness with hypotension: Yes Has patient had a PCN reaction causing severe rash involving mucus membranes or skin necrosis: No Has patient had a PCN reaction that required hospitalization No Has patient had a PCN reaction occurring within the last 10 years: No If all of the above answers are NO, then may proceed with Cephalosporin use.     Current Outpatient Medications  Medication Sig Dispense Refill   amLODipine -olmesartan  (AZOR ) 10-40 MG tablet Take 1 tablet by mouth daily. 30 tablet 11   aspirin  81 MG EC tablet Take 81 mg by mouth every morning. Swallow whole.     carvedilol  (COREG ) 25 MG tablet Take 1 tablet (25 mg total) by mouth 2 (two) times daily with a meal. 180 tablet 3   hydrALAZINE  (APRESOLINE ) 50 MG tablet TAKE (1) TABLET BY MOUTH TWICE DAILY. 180 tablet 3   omega-3 acid ethyl esters (LOVAZA) 1 g capsule Take 2 g by mouth 2 (two) times daily. 2 capsules twice daily.     pantoprazole  (PROTONIX ) 40 MG tablet Take  40 mg by mouth 2 (two) times daily.     pregabalin (LYRICA) 75 MG capsule Take 75 mg by mouth 3 (three) times daily.     No current facility-administered medications for this visit.    REVIEW OF SYSTEMS  All other systems were reviewed and are negative     Objective:  Objective   There were no vitals filed for this visit. There is no height or weight on file to calculate BMI.  Physical Exam General: no acute distress Cardiac: hemodynamically stable Abdomen: non-tender, no pulsatile mass*** Extremities: no edema, cyanosis or wounds*** Vascular:   Right: ***  Left: ***  Data: Aortoiliac duplex ***     Assessment/Plan:   CASEY FYE is a 63 y.o. male with AAA repaired by EVAR in 2017.  He has a known left external  iliac occlusion and claudication. ***  Recommendations to optimize cardiovascular risk: Abstinence from all tobacco products. Blood glucose control with goal A1c < 7%. Blood pressure control with goal blood pressure < 140/90 mmHg. Lipid reduction therapy with goal LDL-C <55 mg/dL  Aspirin  81mg  PO QD.  Atorvastatin  40-80mg  PO QD (or other high intensity statin therapy).   Norman GORMAN Serve MD [facility]

## 2024-04-12 ENCOUNTER — Ambulatory Visit (HOSPITAL_COMMUNITY)

## 2024-04-12 ENCOUNTER — Ambulatory Visit: Admitting: Vascular Surgery

## 2024-05-15 NOTE — Progress Notes (Unsigned)
 "  Patient ID: Shane Faulkner, male   DOB: 01-12-1961, 64 y.o.   MRN: [id number]  Reason for Consult: No chief complaint on file.   Referred by Marvine Rush, MD  Subjective:     HPI Shane Faulkner is a 64 y.o. male presenting for follow-up.  He underwent EVAR for infrarenal abdominal aortic aneurysm with Dr. Oris in 2017.  He was last seen by our office in February 2023.  At that time he was noted to have an excluded aneurysm but with a left iliac limb and external iliac occlusion.  At that time he only had mild claudication and was not interested in any intervention. Today he reports ***  Past Medical History:  Diagnosis Date   AAA (abdominal aortic aneurysm)    Followed by Dr. Oris   Anxiety    Essential hypertension    GERD (gastroesophageal reflux disease)    Headache(784.0)    Hyperlipidemia    Left testicular cancer ([facility]) 06/2012   a. s/p L orchiectomy   Needle phobia    Family History  Problem Relation Age of Onset   AAA (abdominal aortic aneurysm) Father    Hyperlipidemia Father    Past Surgical History:  Procedure Laterality Date   ABDOMINAL AORTIC ENDOVASCULAR STENT GRAFT N/A 01/29/2016   Procedure: ABDOMINAL AORTIC ENDOVASCULAR STENT GRAFT;  Surgeon: Krystal JULIANNA Oris, MD;  Location: [facility];  Service: Vascular;  Laterality: N/A;   ORCHIECTOMY  06/05/2012   Procedure: CASSAUNDRA;  Surgeon: Garnette Shack, MD;  Location: [facility];  Service: Urology;  Laterality: Left;  Left Inguinal Orchiectomy   TONSILLECTOMY  1968    Short Social History:  Social History   Tobacco Use   Smoking status: Every Day    Current packs/day: 1.00    Average packs/day: 1 pack/day for 45.4 years (45.4 ttl pk-yrs)    Types: Cigarettes    Start date: 12/08/1978   Smokeless tobacco: Never  Substance Use Topics   Alcohol use: Yes    Alcohol/week: 24.0 standard drinks of alcohol    Types: 24 Cans of beer per week    Allergies[1]  Current Outpatient Medications  Medication Sig  Dispense Refill   amLODipine -olmesartan  (AZOR ) 10-40 MG tablet Take 1 tablet by mouth daily. 30 tablet 11   aspirin  81 MG EC tablet Take 81 mg by mouth every morning. Swallow whole.     carvedilol  (COREG ) 25 MG tablet Take 1 tablet (25 mg total) by mouth 2 (two) times daily with a meal. 180 tablet 3   hydrALAZINE  (APRESOLINE ) 50 MG tablet TAKE (1) TABLET BY MOUTH TWICE DAILY. 180 tablet 3   omega-3 acid ethyl esters (LOVAZA) 1 g capsule Take 2 g by mouth 2 (two) times daily. 2 capsules twice daily.     pantoprazole  (PROTONIX ) 40 MG tablet Take 40 mg by mouth 2 (two) times daily.     pregabalin (LYRICA) 75 MG capsule Take 75 mg by mouth 3 (three) times daily.     No current facility-administered medications for this visit.    REVIEW OF SYSTEMS  All other systems were reviewed and are negative     Objective:  Objective   There were no vitals filed for this visit. There is no height or weight on file to calculate BMI.  Physical Exam General: no acute distress Cardiac: hemodynamically stable Abdomen: non-tender, no pulsatile mass  Extremities: no edema, cyanosis or wounds*** Vascular:   Right: ***  Left: ***  Data: CTA reviewed ***  Assessment/Plan:   Shane Faulkner is a 64 y.o. male with history of AAA and EVAR repair in 2017 with Dr. Oris.  In 2023 he was noted to have occlusion of his left iliac limb and external iliac and was offered intervention although at that time he only had mild claudication and was not interested in intervention. ***   Norman GORMAN Serve MD [facility]     [1]  Allergies Allergen Reactions   Other Anaphylaxis    Cashews   Penicillins Anaphylaxis    Has patient had a PCN reaction causing immediate rash, facial/tongue/throat swelling, SOB or lightheadedness with hypotension: Yes Has patient had a PCN reaction causing severe rash involving mucus membranes or skin necrosis: No Has patient had a PCN reaction  that required hospitalization No Has patient had a PCN reaction occurring within the last 10 years: No If all of the above answers are NO, then may proceed with Cephalosporin use.    "

## 2024-05-17 ENCOUNTER — Ambulatory Visit: Admitting: Vascular Surgery

## 2024-05-17 ENCOUNTER — Ambulatory Visit (HOSPITAL_COMMUNITY)
